# Patient Record
Sex: Male | Born: 1960 | Race: White | Hispanic: No | Marital: Single | Smoking: Current some day smoker
Health system: Southern US, Community
[De-identification: ages and names within clinical notes are randomized; demographics above are authoritative.]

## PROBLEM LIST (undated history)

## (undated) ENCOUNTER — Ambulatory Visit: Admission: EM | Payer: BLUE CROSS/BLUE SHIELD | Source: Home / Self Care

## (undated) DIAGNOSIS — K219 Gastro-esophageal reflux disease without esophagitis: Secondary | ICD-10-CM

## (undated) DIAGNOSIS — J302 Other seasonal allergic rhinitis: Secondary | ICD-10-CM

## (undated) DIAGNOSIS — I10 Essential (primary) hypertension: Secondary | ICD-10-CM

## (undated) HISTORY — PX: BACK SURGERY: SHX140

---

## 2011-09-29 ENCOUNTER — Emergency Department (INDEPENDENT_AMBULATORY_CARE_PROVIDER_SITE_OTHER)
Admission: EM | Admit: 2011-09-29 | Discharge: 2011-09-29 | Disposition: A | Discharge status: Home / Self Care | Payer: BC Managed Care – PPO | Source: Home / Self Care | Attending: Family Medicine | Admitting: Family Medicine

## 2011-09-29 ENCOUNTER — Emergency Department
Admit: 2011-09-29 | Discharge: 2011-09-29 | Disposition: A | Discharge status: Home / Self Care | Payer: BC Managed Care – PPO

## 2011-09-29 ENCOUNTER — Encounter: Payer: Self-pay | Admitting: *Deleted

## 2011-09-29 DIAGNOSIS — M79672 Pain in left foot: Secondary | ICD-10-CM

## 2011-09-29 DIAGNOSIS — M25579 Pain in unspecified ankle and joints of unspecified foot: Secondary | ICD-10-CM

## 2011-09-29 DIAGNOSIS — M79609 Pain in unspecified limb: Secondary | ICD-10-CM

## 2011-09-29 DIAGNOSIS — M109 Gout, unspecified: Secondary | ICD-10-CM

## 2011-09-29 DIAGNOSIS — M25572 Pain in left ankle and joints of left foot: Secondary | ICD-10-CM

## 2011-09-29 HISTORY — DX: Other seasonal allergic rhinitis: J30.2

## 2011-09-29 HISTORY — DX: Gastro-esophageal reflux disease without esophagitis: K21.9

## 2011-09-29 HISTORY — DX: Essential (primary) hypertension: I10

## 2011-09-29 MED ORDER — PREDNISONE 10 MG PO TABS: ORAL_TABLET | ORAL | Status: DC

## 2011-09-29 MED ORDER — HYDROCODONE-ACETAMINOPHEN 5-500 MG PO TABS: 1.0000 | ORAL_TABLET | Freq: Every evening | ORAL | Status: AC | PRN

## 2011-09-29 NOTE — ED Provider Notes (Signed)
History     CSN: [id number]  Arrival date & time 09/29/11  0941   First MD Initiated Contact with Patient 09/29/11 1028      Chief Complaint  Patient presents with  . Foot Pain     HPI Comments: Patient complains of 3 to 4 day history of pain in his left ankle and foot.  The pain is worse with weight bearing and walking.  He recalls no injury.  He has a history of gout, usually causing pain in his right knee.  He has had no fevers, chills, and sweats   Patient is a 51 y.o. male presenting with lower extremity pain. The history is provided by the patient.  Foot Pain This is a new problem. Episode onset: 3 to 4 days ago. The problem occurs constantly. The problem has not changed since onset.Pertinent negatives include no chest pain, no abdominal pain and no shortness of breath. The symptoms are aggravated by walking and standing. The symptoms are relieved by nothing.    Past Medical History  Diagnosis Date  . GERD (gastroesophageal reflux disease)   . Gout   . Hypertension   . Seasonal allergies     Past Surgical History  Procedure Date  . Back surgery     Family History  Problem Relation Age of Onset  . Hypertension Father     History  Substance Use Topics  . Smoking status: Current Some Day Smoker  . Smokeless tobacco: Not on file  . Alcohol Use: Yes      Review of Systems  Respiratory: Negative for shortness of breath.   Cardiovascular: Negative for chest pain.  Gastrointestinal: Negative for abdominal pain.  All other systems reviewed and are negative.    Allergies  Review of patient's allergies indicates no known allergies.  Home Medications   Current Outpatient Rx  Name Route Sig Dispense Refill  . ALLOPURINOL 100 MG PO TABS Oral Take 100 mg by mouth daily.    Marland Kitchen FEXOFENADINE HCL 180 MG PO TABS Oral Take 180 mg by mouth daily.    Marland Kitchen FLUTICASONE PROPIONATE 50 MCG/ACT NA SUSP Nasal Place 2 sprays into the nose daily.    Marland Kitchen HYDROCHLOROTHIAZIDE 25 MG PO  TABS Oral Take 25 mg by mouth daily.    . INDOMETHACIN 50 MG PO CAPS Oral Take 50 mg by mouth 2 (two) times daily with a meal.    . METOPROLOL SUCCINATE ER 25 MG PO TB24 Oral Take 25 mg by mouth daily.    Marland Kitchen OMEPRAZOLE 20 MG PO CPDR Oral Take 20 mg by mouth daily.    Marland Kitchen HYDROCODONE-ACETAMINOPHEN 5-500 MG PO TABS Oral Take 1 tablet by mouth at bedtime as needed for pain. 10 tablet 0  . PREDNISONE 10 MG PO TABS  Take 2 tabs by mouth twice daily for three days, then one tab twice daily for 2 days, then 1 tab daily for two days.  Take PC 18 tablet 0    BP 114/78  Pulse 62  Temp(Src) 97.7 F (36.5 C) (Oral)  Resp 18  Ht 5\' 7"  (1.702 m)  Wt 215 lb 4 oz (97.637 kg)  BMI 33.71 kg/m2  SpO2 96%  Physical Exam  Nursing note and vitals reviewed. Constitutional: He is oriented to person, place, and time. He appears well-developed and well-nourished.  HENT:  Mouth/Throat: Oropharynx is clear and moist.  Eyes: Conjunctivae and EOM are normal. Pupils are equal, round, and reactive to light.  Neck: Neck supple.  Cardiovascular:  Normal rate, regular rhythm and normal heart sounds.   Pulmonary/Chest: Effort normal.  Abdominal: Soft. There is no tenderness.  Musculoskeletal: He exhibits tenderness.       Left ankle: He exhibits normal range of motion, no swelling, no ecchymosis, no deformity, no laceration and normal pulse. tenderness. Lateral malleolus and medial malleolus tenderness found. Achilles tendon normal.       Left foot: He exhibits tenderness and bony tenderness. He exhibits normal range of motion, no swelling, normal capillary refill, no crepitus, no deformity and no laceration.       Feet:       There is tenderness to palpation as noted on diagram over the first MTP joint and midfoot.  Mild ankle tenderness also palpated.  No erythema or warmth.  Distal Neurovascular function is intact.   Lymphadenopathy:    He has no cervical adenopathy.  Neurological: He is alert and oriented to person,  place, and time.  Skin: Skin is warm and dry. No rash noted. No erythema.    ED Course  Procedures none  Labs Reviewed - No data to display Dg Foot 2 Views Left  09/29/2011  *RADIOLOGY REPORT*  Clinical Data: Left foot pain.  History of gout.  LEFT FOOT - 2 VIEW  Comparison: None  Findings: No acute bony abnormality.  Specifically, no fracture, subluxation, or dislocation.  Soft tissues are intact.  Normal joint spaces and bone mineralization.  IMPRESSION: Unremarkable study.  Original Report Authenticated By: Cyndie Chime, M.D.     1. Left foot pain   2. Left ankle pain   3. Gout       MDM  Begin tapering course of prednisone.  Lortab at bedtime for pain. Increase fluid intake. Followup with Family Doctor if not improved in one week.          Lattie Haw, MD 10/03/11 2222

## 2011-09-29 NOTE — Discharge Instructions (Signed)
Increase fluid intake.   Gout Gout is an inflammatory condition (arthritis) caused by a buildup of uric acid crystals in the joints. Uric acid is a chemical that is normally present in the blood. Under some circumstances, uric acid can form into crystals in your joints. This causes joint redness, soreness, and swelling (inflammation). Repeat attacks are common. Over time, uric acid crystals can form into masses (tophi) near a joint, causing disfigurement. Gout is treatable and often preventable. CAUSES  The disease begins with elevated levels of uric acid in the blood. Uric acid is produced by your body when it breaks down a naturally found substance called purines. This also happens when you eat certain foods such as meats and fish. Causes of an elevated uric acid level include:  Being passed down from parent to child (heredity).   Diseases that cause increased uric acid production (obesity, psoriasis, some cancers).   Excessive alcohol use.   Diet, especially diets rich in meat and seafood.   Medicines, including certain cancer-fighting drugs (chemotherapy), diuretics, and aspirin.   Chronic kidney disease. The kidneys are no longer able to remove uric acid well.   Problems with metabolism.  Conditions strongly associated with gout include:  Obesity.   High blood pressure.   High cholesterol.   Diabetes.  Not everyone with elevated uric acid levels gets gout. It is not understood why some people get gout and others do not. Surgery, joint injury, and eating too much of certain foods are some of the factors that can lead to gout. SYMPTOMS   An attack of gout comes on quickly. It causes intense pain with redness, swelling, and warmth in a joint.   Fever can occur.   Often, only one joint is involved. Certain joints are more commonly involved:   Base of the big toe.   Knee.   Ankle.   Wrist.   Finger.  Without treatment, an attack usually goes away in a few days to  weeks. Between attacks, you usually will not have symptoms, which is different from many other forms of arthritis. DIAGNOSIS  Your caregiver will suspect gout based on your symptoms and exam. Removal of fluid from the joint (arthrocentesis) is done to check for uric acid crystals. Your caregiver will give you a medicine that numbs the area (local anesthetic) and use a needle to remove joint fluid for exam. Gout is confirmed when uric acid crystals are seen in joint fluid, using a special microscope. Sometimes, blood, urine, and X-ray tests are also used. TREATMENT  There are 2 phases to gout treatment: treating the sudden onset (acute) attack and preventing attacks (prophylaxis). Treatment of an Acute Attack  Medicines are used. These include anti-inflammatory medicines or steroid medicines.   An injection of steroid medicine into the affected joint is sometimes necessary.   The painful joint is rested. Movement can worsen the arthritis.   You may use warm or cold treatments on painful joints, depending which works best for you.   Discuss the use of coffee, vitamin C, or cherries with your caregiver. These may be helpful treatment options.  Treatment to Prevent Attacks After the acute attack subsides, your caregiver may advise prophylactic medicine. These medicines either help your kidneys eliminate uric acid from your body or decrease your uric acid production. You may need to stay on these medicines for a very long time. The early phase of treatment with prophylactic medicine can be associated with an increase in acute gout attacks. For this reason,  during the first few months of treatment, your caregiver may also advise you to take medicines usually used for acute gout treatment. Be sure you understand your caregiver's directions. You should also discuss dietary treatment with your caregiver. Certain foods such as meats and fish can increase uric acid levels. Other foods such as dairy can  decrease levels. Your caregiver can give you a list of foods to avoid. HOME CARE INSTRUCTIONS   Do not take aspirin to relieve pain. This raises uric acid levels.   Only take over-the-counter or prescription medicines for pain, discomfort, or fever as directed by your caregiver.   Rest the joint as much as possible. When in bed, keep sheets and blankets off painful areas.   Keep the affected joint raised (elevated).   Use crutches if the painful joint is in your leg.   Drink enough water and fluids to keep your urine clear or pale yellow. This helps your body get rid of uric acid. Do not drink alcoholic beverages. They slow the passage of uric acid.   Follow your caregiver's dietary instructions. Pay careful attention to the amount of protein you eat. Your daily diet should emphasize fruits, vegetables, whole grains, and fat-free or low-fat milk products.   Maintain a healthy body weight.  SEEK MEDICAL CARE IF:   You have an oral temperature above 102 F (38.9 C).   You develop diarrhea, vomiting, or any side effects from medicines.   You do not feel better in 24 hours, or you are getting worse.  SEEK IMMEDIATE MEDICAL CARE IF:   Your joint becomes suddenly more tender and you have:   Chills.   An oral temperature above 102 F (38.9 C), not controlled by medicine.  MAKE SURE YOU:   Understand these instructions.   Will watch your condition.   Will get help right away if you are not doing well or get worse.  Document Released: 06/18/2000 Document Revised: 06/10/2011 Document Reviewed: 09/29/2009 [facility] Patient Information 2012 [place], [place].

## 2011-09-29 NOTE — ED Notes (Signed)
Pt c/o LT foot/ankle pain and swelling  x 3 days, no injury. No OTC meds. He has a hx of gout.

## 2011-10-21 ENCOUNTER — Encounter: Payer: Self-pay | Admitting: *Deleted

## 2011-10-21 ENCOUNTER — Emergency Department
Admission: EM | Admit: 2011-10-21 | Discharge: 2011-10-21 | Disposition: A | Discharge status: Home / Self Care | Payer: BC Managed Care – PPO | Source: Home / Self Care | Attending: Emergency Medicine | Admitting: Emergency Medicine

## 2011-10-21 DIAGNOSIS — M109 Gout, unspecified: Secondary | ICD-10-CM

## 2011-10-21 DIAGNOSIS — M25561 Pain in right knee: Secondary | ICD-10-CM

## 2011-10-21 MED ORDER — PREDNISONE (PAK) 10 MG PO TABS: 10.0000 mg | ORAL_TABLET | Freq: Every day | ORAL | Status: AC

## 2011-10-21 MED ORDER — COLCHICINE 0.6 MG PO TABS: 0.6000 mg | ORAL_TABLET | Freq: Two times a day (BID) | ORAL | Status: DC

## 2011-10-21 NOTE — ED Notes (Signed)
Patient c/o gout flare-up in right knee x 2 days.

## 2011-10-21 NOTE — ED Provider Notes (Signed)
History     CSN: [id number]  Arrival date & time 10/21/11  1132   First MD Initiated Contact with Patient 10/21/11 1152      Chief Complaint  Patient presents with  . Gout    right knee    (Consider location/radiation/quality/duration/timing/severity/associated sxs/prior treatment) HPI This patient complains of right knee pain for 2 days.  He states that he has a history of gout and has been flaring up much more in the past year.  He states that he did have a buffet this past weekend including crab legs.  He has been taking allopurinol daily and indomethacin but he reports that these medicines do not help.  He is also on HCTZ for blood pressure.  He states that the pain is very sore located only in his right knee however has been in multiple other joints in the past.  It hurts to bend and he states this is very similar to his previous gouty flares.  No trauma, falls, tripping.  No fever or chills.  Past Medical History  Diagnosis Date  . GERD (gastroesophageal reflux disease)   . Gout   . Hypertension   . Seasonal allergies     Past Surgical History  Procedure Date  . Back surgery     Family History  Problem Relation Age of Onset  . Hypertension Father     History  Substance Use Topics  . Smoking status: Current Some Day Smoker  . Smokeless tobacco: Not on file  . Alcohol Use: Yes      Review of Systems  All other systems reviewed and are negative.    Allergies  Review of patient's allergies indicates no known allergies.  Home Medications   Current Outpatient Rx  Name Route Sig Dispense Refill  . ALLOPURINOL 100 MG PO TABS Oral Take 100 mg by mouth daily.    . COLCHICINE 0.6 MG PO TABS Oral Take 1 tablet (0.6 mg total) by mouth 2 (two) times daily. 6 tablet 0  . FEXOFENADINE HCL 180 MG PO TABS Oral Take 180 mg by mouth daily.    Marland Kitchen FLUTICASONE PROPIONATE 50 MCG/ACT NA SUSP Nasal Place 2 sprays into the nose daily.    Marland Kitchen HYDROCHLOROTHIAZIDE 25 MG PO TABS  Oral Take 25 mg by mouth daily.    . INDOMETHACIN 50 MG PO CAPS Oral Take 50 mg by mouth 2 (two) times daily with a meal.    . METOPROLOL SUCCINATE ER 25 MG PO TB24 Oral Take 25 mg by mouth daily.    Marland Kitchen OMEPRAZOLE 20 MG PO CPDR Oral Take 20 mg by mouth daily.    Marland Kitchen PREDNISONE 10 MG PO TABS  Take 2 tabs by mouth twice daily for three days, then one tab twice daily for 2 days, then 1 tab daily for two days.  Take PC 18 tablet 0  . PREDNISONE (PAK) 10 MG PO TABS Oral Take 1 tablet (10 mg total) by mouth daily. 6 day pack, use as directed, Disp 1 pack 21 tablet 0    BP 126/85  Pulse 80  Temp(Src) 97.6 F (36.4 C) (Oral)  Resp 14  Ht 5\' 7"  (1.702 m)  SpO2 94%  Physical Exam  Nursing note and vitals reviewed. Constitutional: He is oriented to person, place, and time. He appears well-developed and well-nourished.  HENT:  Head: Normocephalic and atraumatic.  Eyes: No scleral icterus.  Neck: Neck supple.  Cardiovascular: Regular rhythm and normal heart sounds.   Pulmonary/Chest: Effort normal  and breath sounds normal. No respiratory distress.  Musculoskeletal:       Right knee examination demonstrates some mild warmth and global tenderness to palpation.  He would prefer to keep it straight because bending is painful.  He is slightly antalgic gait secondary to his pain.  No medial or lateral joint line tenderness.  No bruising and no swelling.  Distal neurovascular status is intact.  Neurological: He is alert and oriented to person, place, and time.  Skin: Skin is warm and dry.  Psychiatric: He has a normal mood and affect. His speech is normal.    ED Course  Procedures (including critical care time)  Labs Reviewed - No data to display No results found.   1. Gout   2. Right knee pain       MDM   This patient appears to have a acute gout flare.  I have listed on prednisone and also gave him a prescription for colchicine for the next few days and have warned him of the side effects  of diarrhea.  In addition I told him to followup with his PCP next week since his flare is becoming much more frequent.  I advised him that perhaps changing the hydrochlorothiazide different blood pressure medicine may help reduce flares.  Also possible rheumatology evaluation or prophylactic medications such as colchicine to be prescribed, however I will leave this up to the PCP to decide if they would like to pursue further evaluation.  I discussed this with the patient and let him know that his PCP will make the final decision on further treatment however I do believe that HCTZ may be a culprit of his flares.  One of the most important thing he can do is lose weight.   Marlaine Hind, MD 10/21/11 1200

## 2011-10-23 ENCOUNTER — Telehealth: Payer: Self-pay | Admitting: Family Medicine

## 2011-10-23 NOTE — ED Notes (Signed)
Called Lortab 5/500 po prn qs #5 no refills to [facility] on [address] in [place].  Per Dr. Cathren Harsh

## 2011-12-04 ENCOUNTER — Encounter: Payer: Self-pay | Admitting: *Deleted

## 2011-12-04 ENCOUNTER — Emergency Department
Admission: EM | Admit: 2011-12-04 | Discharge: 2011-12-04 | Disposition: A | Discharge status: Home / Self Care | Payer: BC Managed Care – PPO | Source: Home / Self Care | Attending: Emergency Medicine | Admitting: Emergency Medicine

## 2011-12-04 DIAGNOSIS — B029 Zoster without complications: Secondary | ICD-10-CM

## 2011-12-04 MED ORDER — VALACYCLOVIR HCL 1 G PO TABS: 1000.0000 mg | ORAL_TABLET | Freq: Three times a day (TID) | ORAL | Status: AC

## 2011-12-04 MED ORDER — PREDNISONE (PAK) 10 MG PO TABS: 10.0000 mg | ORAL_TABLET | Freq: Every day | ORAL | Status: AC

## 2011-12-04 MED ORDER — TRAMADOL HCL 50 MG PO TABS: 50.0000 mg | ORAL_TABLET | Freq: Four times a day (QID) | ORAL | Status: AC | PRN

## 2011-12-04 NOTE — ED Notes (Signed)
Pt developed a red painful, burning rash days ago.  Denies coming in contact with anything new.  Rash started on Rt thigh and spread to groin and rt butt

## 2011-12-04 NOTE — ED Provider Notes (Signed)
History     CSN: [id number]  Arrival date & time 12/04/11  6578   First MD Initiated Contact with Patient 12/04/11 1006      Chief Complaint  Patient presents with  . Rash    rt thigh and groin    (Consider location/radiation/quality/duration/timing/severity/associated sxs/prior treatment) HPI This patient comes in today complaining of a rash on his right leg and groin.  He states that is very sensitive to the touch.  It popped up a few days ago and his wife noticed that he had a spot that came up today on his buttock.  He is also been complaining of some groin discomfort as well on the same side.  He has been under some stress recently but is otherwise fairly healthy.  He has not been using any medications or modalities previously.  He does have a history of chickenpox when he was younger.  No other exposures that he knows of.    Past Medical History  Diagnosis Date  . GERD (gastroesophageal reflux disease)   . Gout   . Hypertension   . Seasonal allergies     Past Surgical History  Procedure Date  . Back surgery     Family History  Problem Relation Age of Onset  . Hypertension Father     History  Substance Use Topics  . Smoking status: Current Some Day Smoker  . Smokeless tobacco: Not on file  . Alcohol Use: Yes      Review of Systems  All other systems reviewed and are negative.    Allergies  Review of patient's allergies indicates no known allergies.  Home Medications   Current Outpatient Rx  Name Route Sig Dispense Refill  . ALLOPURINOL 100 MG PO TABS Oral Take 100 mg by mouth daily.    . COLCHICINE 0.6 MG PO TABS Oral Take 1 tablet (0.6 mg total) by mouth 2 (two) times daily. 6 tablet 0  . FEXOFENADINE HCL 180 MG PO TABS Oral Take 180 mg by mouth daily.    Marland Kitchen FLUTICASONE PROPIONATE 50 MCG/ACT NA SUSP Nasal Place 2 sprays into the nose daily.    Marland Kitchen HYDROCHLOROTHIAZIDE 25 MG PO TABS Oral Take 25 mg by mouth daily.    . INDOMETHACIN 50 MG PO CAPS Oral  Take 50 mg by mouth 2 (two) times daily with a meal.    . METOPROLOL SUCCINATE ER 25 MG PO TB24 Oral Take 25 mg by mouth daily.    Marland Kitchen OMEPRAZOLE 20 MG PO CPDR Oral Take 20 mg by mouth daily.    Marland Kitchen PREDNISONE 10 MG PO TABS  Take 2 tabs by mouth twice daily for three days, then one tab twice daily for 2 days, then 1 tab daily for two days.  Take PC 18 tablet 0  . PREDNISONE (PAK) 10 MG PO TABS Oral Take 1 tablet (10 mg total) by mouth daily. 6 day pack, use as directed, Disp 1 pack 21 tablet 0  . PREDNISONE (PAK) 10 MG PO TABS Oral Take 1 tablet (10 mg total) by mouth daily. 6 day pack, use as directed, Disp 1 pack 21 tablet 0  . TRAMADOL HCL 50 MG PO TABS Oral Take 1 tablet (50 mg total) by mouth every 6 (six) hours as needed for pain. 16 tablet 0  . TRAMADOL HCL 50 MG PO TABS Oral Take 1 tablet (50 mg total) by mouth every 6 (six) hours as needed for pain. 15 tablet 0  . VALACYCLOVIR HCL 1  G PO TABS Oral Take 1 tablet (1,000 mg total) by mouth 3 (three) times daily. 21 tablet 0  . VALACYCLOVIR HCL 1 G PO TABS Oral Take 1 tablet (1,000 mg total) by mouth 3 (three) times daily. 21 tablet 0    BP 120/88  Pulse 74  Temp(Src) 98.3 F (36.8 C) (Oral)  Resp 16  Ht 5\' 7"  (1.702 m)  Wt 206 lb 8 oz (93.668 kg)  BMI 32.34 kg/m2  SpO2 97%  Physical Exam  Nursing note and vitals reviewed. Constitutional: He is oriented to person, place, and time. He appears well-developed and well-nourished.  HENT:  Head: Normocephalic and atraumatic.  Eyes: No scleral icterus.  Neck: Neck supple.  Cardiovascular: Regular rhythm and normal heart sounds.   Pulmonary/Chest: Effort normal and breath sounds normal. No respiratory distress.  Neurological: He is alert and oriented to person, place, and time.  Skin: Skin is warm and dry.       He has a raised vesicular tender rash on the upper anterior thigh and one more mild on the buttock.  This is consistent the L2 dermatome.  He also has some inguinal lymphadenopathy  on the right side as well.  No other rashes or lesions are seen.  Psychiatric: He has a normal mood and affect. His speech is normal.    ED Course  Procedures (including critical care time)  Labs Reviewed - No data to display No results found.   1. Shingles       MDM   This patient has shingles in the right L2 dermatomal distribution.  I gave him a prescription for Valtrex, prednisone, and tramadol.  I advised him that he can use over-the-counter cortisone cream.  I have let them know that he needs to stay away from small children, pregnant women, and elderly or immunocompromised individuals.  It is not improving, followup with his PCP or a dermatologist.     Marlaine Hind, MD 12/04/11 1018

## 2011-12-18 ENCOUNTER — Encounter: Payer: Self-pay | Admitting: *Deleted

## 2011-12-18 ENCOUNTER — Emergency Department
Admission: EM | Admit: 2011-12-18 | Discharge: 2011-12-18 | Disposition: A | Discharge status: Home / Self Care | Payer: BC Managed Care – PPO | Source: Home / Self Care

## 2011-12-18 DIAGNOSIS — H6090 Unspecified otitis externa, unspecified ear: Secondary | ICD-10-CM

## 2011-12-18 DIAGNOSIS — B029 Zoster without complications: Secondary | ICD-10-CM

## 2011-12-18 DIAGNOSIS — H60399 Other infective otitis externa, unspecified ear: Secondary | ICD-10-CM

## 2011-12-18 MED ORDER — NEOMYCIN-POLYMYXIN-HC 3.5-10000-1 OT SUSP: 4.0000 [drp] | Freq: Two times a day (BID) | OTIC | Status: AC

## 2011-12-18 NOTE — ED Provider Notes (Signed)
History     CSN: [id number]  Arrival date & time 12/18/11  1347   First MD Initiated Contact with Patient 12/18/11 1413      Chief Complaint  Patient presents with  . Otalgia    R    (Consider location/radiation/quality/duration/timing/severity/associated sxs/prior treatment) HPI 1) this patient recently had a case of shingles and was treated.  He is feeling much better and is still using some, and lotion which is helping.  However his physical therapist will not seen again until he is a note from the doctor saying he is no longer contagious.  2) patient also presents with right ear discomfort for a few weeks.  No fever, chills, nausea, vomiting, hearing loss, ringing in the ears.  He does wear a blue tooth headset every day but has needed to use on the left ear instead in the last week.  He does claim that he occasionally washes off of alcohol.  He has not been swimming recently he does use Q-tips.  3) he also complains of chronic left shoulder pain.  He is alert he been followed by [employer] for right shoulder pain and is currently doing physical therapy.  He claims that the left shoulder pain we'll schedule a from a work injury but he is discouraged because nobody will take a look at it and it has never been x-rayed.  Past Medical History  Diagnosis Date  . GERD (gastroesophageal reflux disease)   . Gout   . Hypertension   . Seasonal allergies     Past Surgical History  Procedure Date  . Back surgery     Family History  Problem Relation Age of Onset  . Hypertension Father     History  Substance Use Topics  . Smoking status: Current Some Day Smoker  . Smokeless tobacco: Not on file  . Alcohol Use: Yes      Review of Systems  All other systems reviewed and are negative.    Allergies  Review of patient's allergies indicates no known allergies.  Home Medications   Current Outpatient Rx  Name Route Sig Dispense Refill  . ALLOPURINOL 100 MG PO  TABS Oral Take 100 mg by mouth daily.    . COLCHICINE 0.6 MG PO TABS Oral Take 1 tablet (0.6 mg total) by mouth 2 (two) times daily. 6 tablet 0  . FEXOFENADINE HCL 180 MG PO TABS Oral Take 180 mg by mouth daily.    Marland Kitchen FLUTICASONE PROPIONATE 50 MCG/ACT NA SUSP Nasal Place 2 sprays into the nose daily.    Marland Kitchen HYDROCHLOROTHIAZIDE 25 MG PO TABS Oral Take 25 mg by mouth daily.    . INDOMETHACIN 50 MG PO CAPS Oral Take 50 mg by mouth 2 (two) times daily with a meal.    . METOPROLOL SUCCINATE ER 25 MG PO TB24 Oral Take 25 mg by mouth daily.    . NEOMYCIN-POLYMYXIN-HC 3.5-10000-1 OT SUSP Otic Place 4 drops in ear(s) 2 (two) times daily. 19 mL 9  . OMEPRAZOLE 20 MG PO CPDR Oral Take 20 mg by mouth daily.    Marland Kitchen PREDNISONE 10 MG PO TABS  Take 2 tabs by mouth twice daily for three days, then one tab twice daily for 2 days, then 1 tab daily for two days.  Take PC 18 tablet 0  . VALACYCLOVIR HCL 1 G PO TABS Oral Take 1 tablet (1,000 mg total) by mouth 3 (three) times daily. 21 tablet 0  . VALACYCLOVIR HCL 1 G PO  TABS Oral Take 1 tablet (1,000 mg total) by mouth 3 (three) times daily. 21 tablet 0    BP 115/79  Pulse 69  Temp 98.2 F (36.8 C) (Oral)  Resp 16  Ht 5\' 7"  (1.702 m)  Wt 207 lb 8 oz (94.121 kg)  BMI 32.50 kg/m2  SpO2 98%  Physical Exam  Nursing note and vitals reviewed. Constitutional: He is oriented to person, place, and time. He appears well-developed and well-nourished.  HENT:  Head: Normocephalic and atraumatic.  Nose: Nose normal.  Mouth/Throat: Uvula is midline and oropharynx is clear and moist.       Right ear canal demonstrates some mild irritation and swelling.  The left side is normal.  Both tympanic range are also normal.  Eyes: No scleral icterus.  Neck: Neck supple.  Cardiovascular: Regular rhythm and normal heart sounds.   Pulmonary/Chest: Effort normal and breath sounds normal. No respiratory distress.  Musculoskeletal:       Left shoulder examination is deferred    Neurological: He is alert and oriented to person, place, and time.  Skin: Skin is warm and dry.       Previous shingles rash in his groin on the right side is much improved from previous there is very small areas of healing vesicles but no signs of infection and no tenderness to palpation, no fluctuance or induration.  Psychiatric: He has a normal mood and affect. His speech is normal.    ED Course  Procedures (including critical care time)  Labs Reviewed - No data to display No results found.   1. Otitis externa   2. Shingles       MDM   He has a mild otitis externa of the right ear canal.  I gave him prescription for antibiotic eardrops.  I told him that he needs to keep his blue tooth earphones clean and consider alternating ears as to not irritate him further.  In addition the shingles that he had previously is much improved from before.  He needs a note to return to physical therapy.  We wrapped that note because the rash is much improved and I do not believe that he is contagious a longer period  For his chronic left shoulder pain, and he claims that this is definitely work-related.  I gave him information for the occupational health office downstairs she can make an appointment for.  We did not evaluate shoulder pain today and did not treat it.  Marlaine Hind, MD 12/18/11 1421

## 2011-12-18 NOTE — ED Notes (Signed)
Pt has had R ear pain for approx. 2 wks. Pt also wants shingles rechecked.

## 2012-01-02 ENCOUNTER — Telehealth: Payer: Self-pay | Admitting: Emergency Medicine

## 2012-01-03 ENCOUNTER — Telehealth: Payer: Self-pay

## 2012-01-03 NOTE — ED Notes (Signed)
I called Ciprodex to [facility] per Dr. Alvester Morin. Patient is aware.

## 2012-01-03 NOTE — ED Notes (Signed)
Patient complains his ear is still hurting and would like a different medication called in. He spoke to someone over the weekend and they called in a refill of the same medication he was taking. I sent a staff message to Dr Alvester Morin for him to address.

## 2012-03-09 ENCOUNTER — Ambulatory Visit (INDEPENDENT_AMBULATORY_CARE_PROVIDER_SITE_OTHER): Payer: BC Managed Care – PPO | Admitting: Sports Medicine

## 2012-03-09 ENCOUNTER — Encounter: Payer: Self-pay | Admitting: *Deleted

## 2012-03-09 ENCOUNTER — Emergency Department
Admission: EM | Admit: 2012-03-09 | Discharge: 2012-03-09 | Disposition: A | Discharge status: Home / Self Care | Payer: BC Managed Care – PPO | Source: Home / Self Care

## 2012-03-09 DIAGNOSIS — I10 Essential (primary) hypertension: Secondary | ICD-10-CM

## 2012-03-09 DIAGNOSIS — M109 Gout, unspecified: Secondary | ICD-10-CM

## 2012-03-09 DIAGNOSIS — M25579 Pain in unspecified ankle and joints of unspecified foot: Secondary | ICD-10-CM

## 2012-03-09 DIAGNOSIS — M25571 Pain in right ankle and joints of right foot: Secondary | ICD-10-CM

## 2012-03-09 MED ORDER — PREDNISONE 50 MG PO TABS: ORAL_TABLET | ORAL | Status: AC

## 2012-03-09 NOTE — Assessment & Plan Note (Signed)
This pleasant patient would also like to establish care with me. I think this is appropriate, certainly an ACE inhibitor would be a good option for his hypertension rather than a thiazide in the setting of gout.

## 2012-03-09 NOTE — Progress Notes (Signed)
Patient ID: Eduardo Mclean, male   DOB: 01/09/1961, 51 y.o.   MRN: [id number] Subjective:    I'm seeing this patient as a consultation for: Dr. Alvester Morin    CC: Right ankle pain  HPI: Eduardo Mclean is a very pleasant 51 year old male with a history of gout who comes in with several days of increasing pain, and swelling he localizes in his right ankle. Of note, he was started on hydrochlorothiazide, and allopurinol by another provider. The ankle is red, swollen, and exquisitely painful to bear weight. The pain is localized, does not radiate.  Past medical history, Surgical history, Family history, Social history, Allergies, and medications have been entered into the medical record, reviewed, and no changes needed.   Review of Systems: No headache, visual changes, nausea, vomiting, diarrhea, constipation, dizziness, abdominal pain, skin rash, fevers, chills, night sweats, weight loss, body aches, joint swelling, muscle aches, chest pain, or shortness of breath.   Objective:   Vitals:  Afebrile, vital signs stable. General: Well Developed, well nourished, and in no acute distress.  Neuro: Alert and oriented x3, extra-ocular muscles intact.  Skin: Warm and dry, no rashes noted.  Respiratory: Not using accessory muscles, speaking in full sentences.  Cardiovascular: Pulses palpable, no extremity edema. Right Ankle: Moderately swollen, somewhat erythematous. There is a fluid wave present. Range of motion is full in all directions. Strength is 5/5 in all directions. Stable lateral and medial ligaments; squeeze test and kleiger test unremarkable; Talar dome nontender; No pain at base of 5th MT; No tenderness over cuboid; No tenderness over N spot or navicular prominence No tenderness on posterior aspects of lateral and medial malleolus No sign of peroneal tendon subluxations or tenderness to palpation Negative tarsal tunnel tinel's   Impression and Recommendations:

## 2012-03-09 NOTE — Assessment & Plan Note (Signed)
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 His presentation may represent gout versus pseudogout. Dr. Alvester Morin will put him on a burst of prednisone, as well as acetaminophen. I will see him back in the office next week, and if no better we can consider an aspiration for crystal analysis, as well as injection of corticosteroid into the ankle joint.

## 2012-03-09 NOTE — ED Notes (Signed)
Patient c/o right ankle pain x 2 days, without injury. He has a string hx of gout and reports it often flares up in his ankle.

## 2012-03-09 NOTE — ED Provider Notes (Signed)
History     CSN: [id number]  Arrival date & time 03/09/12  1404   First MD Initiated Contact with Patient 03/09/12 1420      Chief Complaint  Patient presents with  . Ankle Pain    right ankle   HPI Pt presents today with ankle pain x 2-3 days. Pt with baseline hx/o gout that normally affects knees and ankle. Pt states that he was recently started on allopurinol for gout 3-6 months ago. Pt states that he has had intermittent gout flares since this point. Has had recent gout flare for last 2-3 days. No fevers or chills. Has not been able to bear weight on R ankle. Mild R ankle swelling. Pt also currently taking HCTZ for HTN.  Past Medical History  Diagnosis Date  . GERD (gastroesophageal reflux disease)   . Gout   . Hypertension   . Seasonal allergies     Past Surgical History  Procedure Date  . Back surgery     Family History  Problem Relation Age of Onset  . Hypertension Father     History  Substance Use Topics  . Smoking status: Current Some Day Smoker  . Smokeless tobacco: Not on file  . Alcohol Use: Yes      Review of Systems  All other systems reviewed and are negative.    Allergies  Review of patient's allergies indicates no known allergies.  Home Medications   Current Outpatient Rx  Name Route Sig Dispense Refill  . ALLOPURINOL 100 MG PO TABS Oral Take 100 mg by mouth daily.    Marland Kitchen CIPROFLOXACIN-DEXAMETHASONE 0.3-0.1 % OT SUSP Both Ears Place 4 drops into both ears 2 (two) times daily.    . COLCHICINE 0.6 MG PO TABS Oral Take 1 tablet (0.6 mg total) by mouth 2 (two) times daily. 6 tablet 0  . FEXOFENADINE HCL 180 MG PO TABS Oral Take 180 mg by mouth daily.    Marland Kitchen FLUTICASONE PROPIONATE 50 MCG/ACT NA SUSP Nasal Place 2 sprays into the nose daily.    Marland Kitchen HYDROCHLOROTHIAZIDE 25 MG PO TABS Oral Take 25 mg by mouth daily.    . INDOMETHACIN 50 MG PO CAPS Oral Take 50 mg by mouth 2 (two) times daily with a meal.    . METOPROLOL SUCCINATE ER 25 MG PO TB24 Oral  Take 25 mg by mouth daily.    Marland Kitchen OMEPRAZOLE 20 MG PO CPDR Oral Take 20 mg by mouth daily.    Marland Kitchen PREDNISONE 10 MG PO TABS  Take 2 tabs by mouth twice daily for three days, then one tab twice daily for 2 days, then 1 tab daily for two days.  Take PC 18 tablet 0  . VALACYCLOVIR HCL 1 G PO TABS Oral Take 1 tablet (1,000 mg total) by mouth 3 (three) times daily. 21 tablet 0  . VALACYCLOVIR HCL 1 G PO TABS Oral Take 1 tablet (1,000 mg total) by mouth 3 (three) times daily. 21 tablet 0    BP 126/90  Pulse 73  Resp 14  Ht 5\' 7"  (1.702 m)  Wt 206 lb (93.441 kg)  BMI 32.26 kg/m2  SpO2 98%  Physical Exam  Constitutional: He appears well-developed and well-nourished.  HENT:  Head: Normocephalic and atraumatic.  Eyes: Conjunctivae are normal. Pupils are equal, round, and reactive to light.  Neck: Normal range of motion. Neck supple.  Cardiovascular: Normal rate and regular rhythm.   Pulmonary/Chest: Effort normal and breath sounds normal.  Abdominal: Soft. Bowel sounds  are normal.  Musculoskeletal: Normal range of motion.       + ttp ankle R ankle mortice with mild swelling and warmth.  Decreased ROM in all place.  Neurovascularly intact distally   Neurological: He is alert.    ED Course  Procedures (including critical care time)  Labs Reviewed - No data to display No results found.   1. Gout flare       MDM  Will place on prednisone x 7 days.  Broached issues of HCTZ discontinuation as this medication can cause increased uric acid levels.  Broached starting losartan as this can lower uric acid levels.  Discussed general care and infectious red flags.  Follow up as needed.      The patient and/or caregiver has been counseled thoroughly with regard to treatment plan and/or medications prescribed including dosage, schedule, interactions, rationale for use, and possible side effects and they verbalize understanding. Diagnoses and expected course of recovery discussed and will  return if not improved as expected or if the condition worsens. Patient and/or caregiver verbalized understanding.             Doree Albee, MD 03/09/12 [phone]

## 2013-03-07 ENCOUNTER — Encounter: Payer: Self-pay | Admitting: *Deleted

## 2013-03-07 ENCOUNTER — Emergency Department
Admission: EM | Admit: 2013-03-07 | Discharge: 2013-03-07 | Disposition: A | Discharge status: Home / Self Care | Payer: BC Managed Care – PPO | Source: Home / Self Care | Attending: Family Medicine | Admitting: Family Medicine

## 2013-03-07 DIAGNOSIS — L309 Dermatitis, unspecified: Secondary | ICD-10-CM

## 2013-03-07 DIAGNOSIS — L859 Epidermal thickening, unspecified: Secondary | ICD-10-CM

## 2013-03-07 DIAGNOSIS — L851 Acquired keratosis [keratoderma] palmaris et plantaris: Secondary | ICD-10-CM

## 2013-03-07 DIAGNOSIS — L259 Unspecified contact dermatitis, unspecified cause: Secondary | ICD-10-CM

## 2013-03-07 MED ORDER — AMMONIUM LACTATE 12 % EX CREA: TOPICAL_CREAM | CUTANEOUS | Status: DC

## 2013-03-07 MED ORDER — MICONAZOLE NITRATE 2 % EX CREA: TOPICAL_CREAM | CUTANEOUS | Status: DC

## 2013-03-07 MED ORDER — CEPHALEXIN 500 MG PO CAPS: 500.0000 mg | ORAL_CAPSULE | Freq: Two times a day (BID) | ORAL | Status: DC

## 2013-03-07 NOTE — ED Notes (Signed)
Madhav c/o rash to upper buttocks x 7 months, itches. Also has "dry cracked skin" on right 3rd finger.

## 2013-03-07 NOTE — ED Provider Notes (Signed)
CSN: [id number]     Arrival date & time 03/07/13  1623 History   First MD Initiated Contact with Patient 03/07/13 1640     Chief Complaint  Patient presents with  . Rash     HPI Comments: Patient has two dermatological complaints: 1)  For about 6 months he has had a small scaly area on his right 3rd finger that occasionally cracks and becomes irritated. 2)  For about 8 months he has had a pruritic persistent rash on his right medial buttock.  Patient is a 52 y.o. male presenting with rash. The history is provided by the patient.  Rash Pain location: right middle finger and right buttock. Pain quality comment:  Itching on buttock Pain severity:  Mild Onset quality:  Gradual Timing:  Constant Progression:  Unchanged Chronicity:  Chronic Relieved by:  Nothing Worsened by:  Nothing tried Ineffective treatments: 1% hydrocortisone cream. Associated symptoms: no fever     Past Medical History  Diagnosis Date  . GERD (gastroesophageal reflux disease)   . Gout   . Hypertension   . Seasonal allergies    Past Surgical History  Procedure Laterality Date  . Back surgery     Family History  Problem Relation Age of Onset  . Hypertension Father    History  Substance Use Topics  . Smoking status: Current Some Day Smoker  . Smokeless tobacco: Never Used  . Alcohol Use: Yes    Review of Systems  Constitutional: Negative for fever.  Skin: Positive for rash.  All other systems reviewed and are negative.    Allergies  Review of patient's allergies indicates no known allergies.  Home Medications   Current Outpatient Rx  Name  Route  Sig  Dispense  Refill  . allopurinol (ZYLOPRIM) 100 MG tablet   Oral   Take 100 mg by mouth daily.         Marland Kitchen ammonium lactate (LAC-HYDRIN) 12 % cream      Apply small amount to finger BID   140 g   0   . cephALEXin (KEFLEX) 500 MG capsule   Oral   Take 1 capsule (500 mg total) by mouth 2 (two) times daily.   14 capsule   0   .  fexofenadine (ALLEGRA) 180 MG tablet   Oral   Take 180 mg by mouth daily.         . fluticasone (FLONASE) 50 MCG/ACT nasal spray   Nasal   Place 2 sprays into the nose daily.         . hydrochlorothiazide (HYDRODIURIL) 25 MG tablet   Oral   Take 25 mg by mouth daily.         . indomethacin (INDOCIN) 50 MG capsule   Oral   Take 50 mg by mouth 2 (two) times daily with a meal.         . metoprolol succinate (TOPROL-XL) 25 MG 24 hr tablet   Oral   Take 25 mg by mouth daily.         . miconazole (MICATIN) 2 % cream      Apply to buttock lesion two times daily   30 g   0   . omeprazole (PRILOSEC) 20 MG capsule   Oral   Take 20 mg by mouth daily.          BP 172/106  Pulse 66  Temp(Src) 98.1 F (36.7 C) (Oral)  Resp 14  Ht 5\' 7"  (1.702 m)  Wt 213 lb (  96.616 kg)  BMI 33.35 kg/m2  SpO2 95% Physical Exam  Nursing note and vitals reviewed. Constitutional: He is oriented to person, place, and time. He appears well-developed and well-nourished. No distress.  HENT:  Head: Normocephalic.  Eyes: Conjunctivae are normal. Pupils are equal, round, and reactive to light.  Musculoskeletal:       Hands: On the radial aspect of patient's right third finger, as noted on diagram, is an 6mm by 3mm area of non-specific hyperkeratosis.  Does not appear to be an actual lesion.  No tenderness or erythema.  Neurological: He is alert and oriented to person, place, and time.  Skin: Skin is warm and dry.     On patient's right medial buttock, in intertriginous area as noted on diagram, is a 1cm by 4cm moist macular slightly erythematous and hypopigmented area.  No tenderness to palpation.  No induration or fluctuance    ED Course  Procedures  none    Labs Reviewed  WOUND CULTURE pending       MDM   1. Hyperkeratosis of skin (finger)  2. Dermatitis of perianal region, ?fungal etiology with secondary bacterial infection  Note hypertension on exam   Wound culture lesion  buttock pending. Begin Lac Hydrin cream to hyperkeratosis on finger. Begin Keflex 500mg  BID.  Apply miconazole cream to lesion right buttock May continue 1% hydrocortisone ointment on buttock lesion. Followup with dermatologist if not improving Followup with PCP for hypertension.    Lattie Haw, MD 03/12/13 1036

## 2013-03-13 ENCOUNTER — Telehealth: Payer: Self-pay | Admitting: *Deleted

## 2013-06-11 ENCOUNTER — Encounter: Payer: Self-pay | Admitting: Emergency Medicine

## 2013-06-11 ENCOUNTER — Emergency Department
Admission: EM | Admit: 2013-06-11 | Discharge: 2013-06-11 | Disposition: A | Discharge status: Home / Self Care | Payer: BC Managed Care – PPO | Source: Home / Self Care | Attending: Family Medicine | Admitting: Family Medicine

## 2013-06-11 ENCOUNTER — Emergency Department (INDEPENDENT_AMBULATORY_CARE_PROVIDER_SITE_OTHER): Payer: BC Managed Care – PPO

## 2013-06-11 DIAGNOSIS — R1033 Periumbilical pain: Secondary | ICD-10-CM

## 2013-06-11 DIAGNOSIS — M545 Low back pain: Secondary | ICD-10-CM

## 2013-06-11 DIAGNOSIS — M5410 Radiculopathy, site unspecified: Secondary | ICD-10-CM

## 2013-06-11 DIAGNOSIS — IMO0002 Reserved for concepts with insufficient information to code with codable children: Secondary | ICD-10-CM

## 2013-06-11 MED ORDER — HYDROCODONE-ACETAMINOPHEN 5-325 MG PO TABS: ORAL_TABLET | ORAL | Status: DC

## 2013-06-11 MED ORDER — CYCLOBENZAPRINE HCL 10 MG PO TABS: 10.0000 mg | ORAL_TABLET | Freq: Three times a day (TID) | ORAL | Status: DC

## 2013-06-11 MED ORDER — PREDNISONE 20 MG PO TABS: 20.0000 mg | ORAL_TABLET | Freq: Two times a day (BID) | ORAL | Status: DC

## 2013-06-11 NOTE — ED Notes (Signed)
Eduardo Mclean c/o right lower back pain that radiates to buttocks and right thigh without injury x 2 weeks. He also c/o naval soreness without bulging x 2 weeks.
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
## 2013-06-18 ENCOUNTER — Encounter: Payer: Self-pay | Admitting: Sports Medicine

## 2013-06-18 ENCOUNTER — Ambulatory Visit (INDEPENDENT_AMBULATORY_CARE_PROVIDER_SITE_OTHER): Payer: BC Managed Care – PPO | Admitting: Sports Medicine

## 2013-06-18 VITALS — BP 153/93 | HR 75 | Wt 211.0 lb

## 2013-06-18 DIAGNOSIS — M109 Gout, unspecified: Secondary | ICD-10-CM

## 2013-06-18 DIAGNOSIS — Z0000 Encounter for general adult medical examination without abnormal findings: Secondary | ICD-10-CM

## 2013-06-18 DIAGNOSIS — I10 Essential (primary) hypertension: Secondary | ICD-10-CM

## 2013-06-18 DIAGNOSIS — R1033 Periumbilical pain: Secondary | ICD-10-CM | POA: Insufficient documentation

## 2013-06-18 DIAGNOSIS — Z299 Encounter for prophylactic measures, unspecified: Secondary | ICD-10-CM | POA: Insufficient documentation

## 2013-06-18 MED ORDER — LOSARTAN POTASSIUM-HCTZ 100-25 MG PO TABS: 1.0000 | ORAL_TABLET | Freq: Every day | ORAL | Status: DC

## 2013-06-18 NOTE — Progress Notes (Signed)
  Subjective:    CC: Establish care.   HPI:  Gout: Well controlled.  Hypertension: Still elevated, he did not have any worsening gout since doing hydrochlorothiazide. No headaches, shortness of breath, chest pain.  Umbilical pain: Noted earlier, mild and worse with palpation and Valsalva, present for months, was told it might be a hernia by the urgent care physician.  Preventive measure: Desires complete physical today. Has not yet had a colonoscopy.  Past medical history, Surgical history, Family history not pertinant except as noted below, Social history, Allergies, and medications have been entered into the medical record, reviewed, and no changes needed.   Review of Systems: No headache, visual changes, nausea, vomiting, diarrhea, constipation, dizziness, abdominal pain, skin rash, fevers, chills, night sweats, swollen lymph nodes, weight loss, chest pain, body aches, joint swelling, muscle aches, shortness of breath, mood changes, visual or auditory hallucinations.  Objective:    General: Well Developed, well nourished, and in no acute distress.  Neuro: Alert and oriented x3, extra-ocular muscles intact, sensation grossly intact.  HEENT: Normocephalic, atraumatic, pupils equal round reactive to light, neck supple, no masses, no lymphadenopathy, thyroid nonpalpable.  Skin: Warm and dry, no rashes noted.  Cardiac: Regular rate and rhythm, no murmurs rubs or gallops.  Respiratory: Clear to auscultation bilaterally. Not using accessory muscles, speaking in full sentences.  Abdominal: Soft, nontender, nondistended, positive bowel sounds, no masses, no organomegaly.  Musculoskeletal: Shoulder, elbow, wrist, hip, knee, ankle stable, and with full range of motion.  Impression and Recommendations:    The patient was counselled, risk factors were discussed, anticipatory guidance given.

## 2013-06-18 NOTE — Assessment & Plan Note (Signed)
Switching to losartan/HCTZ. Return in 2 weeks to recheck.

## 2013-06-18 NOTE — Assessment & Plan Note (Signed)
Continue allopurinol 

## 2013-06-18 NOTE — Assessment & Plan Note (Signed)
It is possible this represents an umbilical hernia. Referral to Gen. surgery.

## 2013-06-18 NOTE — Assessment & Plan Note (Addendum)
Complete physical. Checking routine blood work. Referral to gastroenterology for colon cancer screening.

## 2013-06-19 ENCOUNTER — Telehealth: Payer: Self-pay

## 2013-06-19 LAB — LIPID PANEL
Cholesterol: 124 mg/dL (ref 0–200)
HDL: 60 mg/dL (ref >39)
LDL Cholesterol: 26 mg/dL (ref 0–99)
Total CHOL/HDL Ratio: 2.1 ratio
Triglycerides: 190 mg/dL — ABNORMAL HIGH (ref <150)
VLDL: 38 mg/dL (ref 0–40)

## 2013-06-19 LAB — COMPREHENSIVE METABOLIC PANEL
ALT: 32 U/L (ref 0–53)
CO2: 22 mEq/L (ref 19–32)
Calcium: 9.2 mg/dL (ref 8.4–10.5)
Chloride: 107 mEq/L (ref 96–112)
Creat: 1.06 mg/dL (ref 0.50–1.35)
Glucose, Bld: 86 mg/dL (ref 70–99)
Sodium: 143 mEq/L (ref 135–145)
Total Bilirubin: 0.3 mg/dL (ref 0.3–1.2)
Total Protein: 7.3 g/dL (ref 6.0–8.3)

## 2013-06-19 LAB — CBC
HCT: 43.7 % (ref 39.0–52.0)
Hemoglobin: 14.8 g/dL (ref 13.0–17.0)
MCH: 29 pg (ref 26.0–34.0)
MCHC: 33.9 g/dL (ref 30.0–36.0)
MCV: 85.5 fL (ref 78.0–100.0)
Platelets: 194 10*3/uL (ref 150–400)
RBC: 5.11 MIL/uL (ref 4.22–5.81)
RDW: 15.1 % (ref 11.5–15.5)
WBC: 6.8 10*3/uL (ref 4.0–10.5)

## 2013-06-19 LAB — TESTOSTERONE, FREE, TOTAL, SHBG
Sex Hormone Binding: 24 nmol/L (ref 13–71)
Testosterone, Free: 63.5 pg/mL (ref 47.0–244.0)
Testosterone-% Free: 2.3 % (ref 1.6–2.9)
Testosterone: 274 ng/dL — ABNORMAL LOW (ref 300–890)

## 2013-06-19 LAB — TSH: TSH: 0.461 u[IU]/mL (ref 0.350–4.500)

## 2013-06-19 LAB — HEMOGLOBIN A1C
Hgb A1c MFr Bld: 6.2 % — ABNORMAL HIGH (ref <5.7)
Mean Plasma Glucose: 131 mg/dL — ABNORMAL HIGH (ref <117)

## 2013-06-19 LAB — COMPREHENSIVE METABOLIC PANEL WITH GFR
AST: 25 U/L (ref 0–37)
Albumin: 4.5 g/dL (ref 3.5–5.2)
Alkaline Phosphatase: 67 U/L (ref 39–117)
BUN: 14 mg/dL (ref 6–23)
Potassium: 4.2 meq/L (ref 3.5–5.3)

## 2013-06-19 LAB — URIC ACID: Uric Acid, Serum: 3.3 mg/dL — ABNORMAL LOW (ref 4.0–7.8)

## 2013-06-19 MED ORDER — AMLODIPINE BESYLATE 10 MG PO TABS: 5.0000 mg | ORAL_TABLET | Freq: Every day | ORAL | Status: DC

## 2013-06-19 MED ORDER — LOSARTAN POTASSIUM 100 MG PO TABS: 100.0000 mg | ORAL_TABLET | Freq: Every day | ORAL | Status: DC

## 2013-06-19 NOTE — Telephone Encounter (Signed)
Spoke to patient advised him that Amlodpine was also added. Mackie Holness,CMA

## 2013-06-19 NOTE — Telephone Encounter (Signed)
Patient called stated that he is only taking the Losartan and not the HTCZ he is requesting a refill of that medication and the metoprolol  Sent to his pharmacy. Rhonda Cunningham,CMA

## 2013-06-19 NOTE — Telephone Encounter (Signed)
Losartan alone may not be enough, I am going to add amlodipine 10 mg, he will use one half tablet until he sees me again.

## 2013-06-23 ENCOUNTER — Emergency Department
Admission: EM | Admit: 2013-06-23 | Discharge: 2013-06-23 | Disposition: A | Discharge status: Home / Self Care | Payer: BC Managed Care – PPO | Source: Home / Self Care | Attending: Family Medicine | Admitting: Family Medicine

## 2013-06-23 ENCOUNTER — Encounter: Payer: Self-pay | Admitting: Emergency Medicine

## 2013-06-23 DIAGNOSIS — R110 Nausea: Secondary | ICD-10-CM

## 2013-06-23 DIAGNOSIS — R197 Diarrhea, unspecified: Secondary | ICD-10-CM

## 2013-06-23 MED ORDER — ONDANSETRON 4 MG PO TBDP: 4.0000 mg | ORAL_TABLET | Freq: Three times a day (TID) | ORAL | Status: DC | PRN

## 2013-06-23 MED ORDER — HYDROCODONE-ACETAMINOPHEN 5-325 MG PO TABS: ORAL_TABLET | ORAL | Status: DC

## 2013-06-23 MED ORDER — ONDANSETRON 4 MG PO TBDP: 4.0000 mg | ORAL_TABLET | Freq: Once | ORAL | Status: DC

## 2013-06-23 NOTE — ED Provider Notes (Signed)
CSN: [id number]     Arrival date & time 06/23/13  1436 History   First MD Initiated Contact with Patient 06/23/13 1630     Chief Complaint  Patient presents with  . Fever    x 1 day  . Emesis    x 1 day  . Diarrhea    x 1 day      HPI Comments: Patient complains of two day history of myalgias, chills/sweats, abdominal cramps, nausea/vomiting, headache, and watery diarrhea.  Denies recent foreign travel, or drinking untreated water in a wilderness environment.   He is able to drink fluids  The history is provided by the patient.    Past Medical History  Diagnosis Date  . GERD (gastroesophageal reflux disease)   . Gout   . Hypertension   . Seasonal allergies    Past Surgical History  Procedure Laterality Date  . Back surgery     Family History  Problem Relation Age of Onset  . Hypertension Father    History  Substance Use Topics  . Smoking status: Current Some Day Smoker  . Smokeless tobacco: Never Used  . Alcohol Use: Yes    Review of Systems No sore throat Np cough No pleuritic pain No wheezing No nasal congestion No post-nasal drainage No sinus pain/pressure No itchy/red eyes No earache No hemoptysis No SOB + fever, + chills + nausea + vomiting + abdominal pain + diarrhea No urinary symptoms No skin rash + fatigue + myalgias + headache Used OTC meds without relief  Allergies  Review of patient's allergies indicates no known allergies.  Home Medications   Current Outpatient Rx  Name  Route  Sig  Dispense  Refill  . allopurinol (ZYLOPRIM) 100 MG tablet   Oral   Take 100 mg by mouth daily.         Marland Kitchen amLODipine (NORVASC) 10 MG tablet   Oral   Take 0.5 tablets (5 mg total) by mouth daily.   30 tablet   11   . cyclobenzaprine (FLEXERIL) 10 MG tablet   Oral   Take 1 tablet (10 mg total) by mouth 3 (three) times daily.   30 tablet   0   . fexofenadine (ALLEGRA) 180 MG tablet   Oral   Take 180 mg by mouth daily.         .  fluticasone (FLONASE) 50 MCG/ACT nasal spray   Nasal   Place 2 sprays into the nose daily.         Marland Kitchen HYDROcodone-acetaminophen (NORCO/VICODIN) 5-325 MG per tablet      Take one by mouth at bedtime as needed for pain   10 tablet   0   . indomethacin (INDOCIN) 50 MG capsule   Oral   Take 50 mg by mouth 2 (two) times daily with a meal.         . losartan (COZAAR) 100 MG tablet   Oral   Take 1 tablet (100 mg total) by mouth daily.   90 tablet   0   . metoprolol succinate (TOPROL-XL) 25 MG 24 hr tablet   Oral   Take 25 mg by mouth daily.         Marland Kitchen omeprazole (PRILOSEC) 20 MG capsule   Oral   Take 20 mg by mouth daily.         . ondansetron (ZOFRAN ODT) 4 MG disintegrating tablet   Oral   Take 1 tablet (4 mg total) by mouth every 8 (eight) hours  as needed for nausea or vomiting.   15 tablet   0   . predniSONE (DELTASONE) 20 MG tablet   Oral   Take 1 tablet (20 mg total) by mouth 2 (two) times daily.   10 tablet   0    BP 161/113  Pulse 65  Temp(Src) 99.4 F (37.4 C)  Ht 5\' 7"  (1.702 m)  Wt 209 lb (94.802 kg)  BMI 32.73 kg/m2  SpO2 96% Physical Exam Nursing notes and Vital Signs reviewed. Appearance:  Patient appears stated age, and in no acute distress.  Patient is obese. Eyes:  Pupils are equal, round, and reactive to light and accomodation.  Extraocular movement is intact.  Conjunctivae are not inflamed  Ears:  Canals normal.  Tympanic membranes normal.  Nose:  Normal turbinates.  No sinus tenderness.   Pharynx:  Normal; moist mucous membranes  Neck:  Supple.  No adenopathy Lungs:  Clear to auscultation.  Breath sounds are equal.  Heart:  Regular rate and rhythm without murmurs, rubs, or gallops.  Abdomen:  Nontender without masses or hepatosplenomegaly.  Bowel sounds are increased.  No CVA or flank tenderness.  Extremities:  No edema.  No calf tenderness Skin:  No rash present.   ED Course  Procedures  none      MDM   1. Nausea alone; suspect  viral gastroenteritis   2. Diarrhea    Zofran 4mg  PO Rx for zofran Begin clear liquids (adult rehydration solution while having diarrhea) until improved, then advance to a bland diet.  Then gradually resume a regular diet when tolerated.  Avoid milk products until well.  To decrease diarrhea, mix one heaping tablespoon Citrucel (methylcellulose) in 8 oz water and drink one to three times daily.  When stools become more formed, may take Imodium (loperamide) once or twice daily to decrease stool frequency.  Followup with PCP for decreased control BP If symptoms become significantly worse during the night or over the weekend, proceed to the local emergency room.    Lattie Haw, MD 06/25/13 [phone]

## 2013-06-23 NOTE — ED Notes (Signed)
Linsey complains of fever, chills, sweats, nausea, vomiting, diarrhea and body aches for 1 day.

## 2013-07-16 ENCOUNTER — Ambulatory Visit (INDEPENDENT_AMBULATORY_CARE_PROVIDER_SITE_OTHER): Payer: BC Managed Care – PPO | Admitting: Sports Medicine

## 2013-07-16 ENCOUNTER — Encounter: Payer: Self-pay | Admitting: Sports Medicine

## 2013-07-16 VITALS — BP 130/85 | HR 69 | Wt 221.0 lb

## 2013-07-16 DIAGNOSIS — M545 Low back pain, unspecified: Secondary | ICD-10-CM | POA: Insufficient documentation

## 2013-07-16 DIAGNOSIS — N529 Male erectile dysfunction, unspecified: Secondary | ICD-10-CM

## 2013-07-16 DIAGNOSIS — M109 Gout, unspecified: Secondary | ICD-10-CM

## 2013-07-16 DIAGNOSIS — Z299 Encounter for prophylactic measures, unspecified: Secondary | ICD-10-CM

## 2013-07-16 DIAGNOSIS — I10 Essential (primary) hypertension: Secondary | ICD-10-CM

## 2013-07-16 DIAGNOSIS — E291 Testicular hypofunction: Secondary | ICD-10-CM

## 2013-07-16 MED ORDER — MELOXICAM 15 MG PO TABS: ORAL_TABLET | ORAL | Status: DC

## 2013-07-16 MED ORDER — VARDENAFIL HCL 20 MG PO TABS: 20.0000 mg | ORAL_TABLET | ORAL | Status: DC | PRN

## 2013-07-16 NOTE — Progress Notes (Signed)
  Subjective:    CC: Follow up  HPI: Hypertension: Well controlled.  Erectile dysfunction: Needs a refill on Levitra.  Low back pain: Moderate, persistent, no injuries, localized in the midline of the low back radiating down both legs, worse when going from a sitting to a standing position, no bowel or bladder dysfunction, no constitutional symptoms.  Male hypogonadism: would like to think about ways to supplement this.  Past medical history, Surgical history, Family history not pertinant except as noted below, Social history, Allergies, and medications have been entered into the medical record, reviewed, and no changes needed.   Review of Systems: No fevers, chills, night sweats, weight loss, chest pain, or shortness of breath.   Objective:    General: Well Developed, well nourished, and in no acute distress.  Neuro: Alert and oriented x3, extra-ocular muscles intact, sensation grossly intact.  HEENT: Normocephalic, atraumatic, pupils equal round reactive to light, neck supple, no masses, no lymphadenopathy, thyroid nonpalpable.  Skin: Warm and dry, no rashes. Cardiac: Regular rate and rhythm, no murmurs rubs or gallops, no lower extremity edema.  Respiratory: Clear to auscultation bilaterally. Not using accessory muscles, speaking in full sentences. Back Exam:  Inspection: Unremarkable  Motion: Flexion 45 deg, Extension 45 deg, Side Bending to 45 deg bilaterally,  Rotation to 45 deg bilaterally  SLR laying: Negative  XSLR laying: Negative  Palpable tenderness: None. FABER: negative. Sensory change: Gross sensation intact to all lumbar and sacral dermatomes.  Reflexes: 2+ at both patellar tendons, 2+ at achilles tendons, Babinski's downgoing.  Strength at foot  Plantar-flexion: 5/5 Dorsi-flexion: 5/5 Eversion: 5/5 Inversion: 5/5  Leg strength  Quad: 5/5 Hamstring: 5/5 Hip flexor: 5/5 Hip abductors: 5/5  Gait unremarkable.  Impression and Recommendations:

## 2013-07-16 NOTE — Assessment & Plan Note (Signed)
We discussed the various forms of testosterone supplementation as well as the risks. He is going to do some research and will let me know whether he desires to do gel, patch, or injections.

## 2013-07-16 NOTE — Assessment & Plan Note (Signed)
Currently well-controlled on amlodipine, losartan, metoprolol. No changes.

## 2013-07-16 NOTE — Assessment & Plan Note (Signed)
Refilling Levitra 20mg .

## 2013-07-16 NOTE — Assessment & Plan Note (Signed)
Patient is currently scheduled for colonoscopy.

## 2013-07-16 NOTE — Assessment & Plan Note (Signed)
Mobic, home exercises. X-rays do show facet spondylosis. Return in one month, MRI if no better.

## 2013-07-16 NOTE — Assessment & Plan Note (Signed)
No acute flares, uric acid levels of 3.3. No changes.

## 2013-08-13 ENCOUNTER — Ambulatory Visit: Payer: BC Managed Care – PPO | Admitting: Sports Medicine

## 2013-08-14 ENCOUNTER — Ambulatory Visit (INDEPENDENT_AMBULATORY_CARE_PROVIDER_SITE_OTHER): Payer: BC Managed Care – PPO | Admitting: Sports Medicine

## 2013-08-14 ENCOUNTER — Encounter: Payer: Self-pay | Admitting: Sports Medicine

## 2013-08-14 VITALS — BP 145/88 | HR 72 | Ht 67.0 in | Wt 220.0 lb

## 2013-08-14 DIAGNOSIS — M545 Low back pain, unspecified: Secondary | ICD-10-CM

## 2013-08-14 DIAGNOSIS — E291 Testicular hypofunction: Secondary | ICD-10-CM

## 2013-08-14 MED ORDER — TESTOSTERONE CYPIONATE 200 MG/ML IM SOLN
200.0000 mg | INTRAMUSCULAR | Status: DC
  Administered 2013-08-14: 200 mg via INTRAMUSCULAR

## 2013-08-14 MED ORDER — PREDNISONE (PAK) 10 MG PO TABS: ORAL_TABLET | ORAL | Status: DC

## 2013-08-14 NOTE — Assessment & Plan Note (Signed)
Testosterone 200 mg intramuscular every 2 weeks. In a month and a half we will check his testosterone levels.

## 2013-08-14 NOTE — Progress Notes (Signed)
  Subjective:    CC: Followup  HPI: Lumbar spondylosis: With facet degenerative changes on x-ray, has been doing home exercises and taking Mobic, overall he is better but would like to try a course of prednisone. He is not yet ready to consider interventional treatment.  Male hypogonadism: Desires to start testosterone injections today. Risks and benefits explained.  Hypertension: Typically well controlled, in pain and somewhat elevated today.  Past medical history, Surgical history, Family history not pertinant except as noted below, Social history, Allergies, and medications have been entered into the medical record, reviewed, and no changes needed.   Review of Systems: No fevers, chills, night sweats, weight loss, chest pain, or shortness of breath.   Objective:    General: Well Developed, well nourished, and in no acute distress.  Neuro: Alert and oriented x3, extra-ocular muscles intact, sensation grossly intact.  HEENT: Normocephalic, atraumatic, pupils equal round reactive to light, neck supple, no masses, no lymphadenopathy, thyroid nonpalpable.  Skin: Warm and dry, no rashes. Cardiac: Regular rate and rhythm, no murmurs rubs or gallops, no lower extremity edema.  Respiratory: Clear to auscultation bilaterally. Not using accessory muscles, speaking in full sentences.  Impression and Recommendations:

## 2013-08-14 NOTE — Assessment & Plan Note (Signed)
Mild facet arthrosis on x-ray. Insufficient improvement with Mobic. Prednisone per patient request, he will return in 2 weeks and we can consider MRI if no better.

## 2013-08-27 ENCOUNTER — Ambulatory Visit: Payer: BC Managed Care – PPO | Admitting: Sports Medicine

## 2013-08-28 ENCOUNTER — Encounter: Payer: Self-pay | Admitting: Sports Medicine

## 2013-08-28 ENCOUNTER — Ambulatory Visit (INDEPENDENT_AMBULATORY_CARE_PROVIDER_SITE_OTHER): Payer: BC Managed Care – PPO | Admitting: Sports Medicine

## 2013-08-28 VITALS — BP 127/79 | HR 75 | Temp 97.6°F | Ht 67.0 in | Wt 227.0 lb

## 2013-08-28 DIAGNOSIS — E291 Testicular hypofunction: Secondary | ICD-10-CM

## 2013-08-28 DIAGNOSIS — M545 Low back pain, unspecified: Secondary | ICD-10-CM

## 2013-08-28 MED ORDER — TESTOSTERONE CYPIONATE 200 MG/ML IM SOLN
200.0000 mg | Freq: Once | INTRAMUSCULAR | Status: AC
  Administered 2013-08-28: 200 mg via INTRAMUSCULAR

## 2013-08-28 NOTE — Progress Notes (Signed)
  Subjective:    CC: Followup  HPI: Low back pain: Localized midline radiating down both legs, worse when going from sitting to standing, moderate, persistent however has now resolved since doing prednisone. Patient is happy with the results so far, he does desire to continue his home exercises, he's going to come back to see me on an as-needed basis of the back pain recurs.  Male hypogonadism: Testosterone injection given.  Past medical history, Surgical history, Family history not pertinant except as noted below, Social history, Allergies, and medications have been entered into the medical record, reviewed, and no changes needed.   Review of Systems: No fevers, chills, night sweats, weight loss, chest pain, or shortness of breath.   Objective:    General: Well Developed, well nourished, and in no acute distress.  Neuro: Alert and oriented x3, extra-ocular muscles intact, sensation grossly intact.  HEENT: Normocephalic, atraumatic, pupils equal round reactive to light, neck supple, no masses, no lymphadenopathy, thyroid nonpalpable.  Skin: Warm and dry, no rashes. Cardiac: Regular rate and rhythm, no murmurs rubs or gallops, no lower extremity edema.  Respiratory: Clear to auscultation bilaterally. Not using accessory muscles, speaking in full sentences.   Impression and Recommendations:

## 2013-08-28 NOTE — Assessment & Plan Note (Signed)
Continues to do well, symptoms do sound discogenic however he had widespread facet arthritis as well as multilevel degenerative changes in his lumbar discs. At this point we will just watch this for now, and proceed with MRI if he calls back with worsening pain.

## 2013-08-28 NOTE — Assessment & Plan Note (Signed)
Testosterone injection as above. 

## 2013-08-29 ENCOUNTER — Ambulatory Visit: Payer: BC Managed Care – PPO | Admitting: Sports Medicine

## 2013-09-03 ENCOUNTER — Other Ambulatory Visit: Payer: Self-pay | Admitting: *Deleted

## 2013-09-03 MED ORDER — OMEPRAZOLE 20 MG PO CPDR: 20.0000 mg | DELAYED_RELEASE_CAPSULE | Freq: Every day | ORAL | Status: DC

## 2013-09-07 ENCOUNTER — Encounter: Payer: Self-pay | Admitting: Emergency Medicine

## 2013-09-07 ENCOUNTER — Emergency Department
Admission: EM | Admit: 2013-09-07 | Discharge: 2013-09-07 | Disposition: A | Discharge status: Home / Self Care | Payer: BC Managed Care – PPO | Source: Home / Self Care | Attending: Family Medicine | Admitting: Family Medicine

## 2013-09-07 DIAGNOSIS — K644 Residual hemorrhoidal skin tags: Secondary | ICD-10-CM

## 2013-09-07 MED ORDER — HYDROCORTISONE ACETATE 25 MG RE SUPP: 25.0000 mg | Freq: Two times a day (BID) | RECTAL | Status: DC

## 2013-09-07 NOTE — ED Provider Notes (Signed)
CSN: [id number]     Arrival date & time 09/07/13  1110 History   First MD Initiated Contact with Patient 09/07/13 1201     Chief Complaint  Patient presents with  . Hemorrhoids      HPI Comments: Patient states that he recently underwent colonoscopy.   Over the past four days he has had rectal burning and pain with defecation, and 3 days ago he noted a small amount of blood on toilet tissue.  He complains of pain when sitting.  He reports that his screening colonoscopy was negative except for a polyp, and pathology was negative.  Patient is a 53 y.o. male presenting with hematochezia. The history is provided by the patient.  Rectal Bleeding Quality:  Bright red Amount:  Scant Timing:  Rare Progression:  Resolved Chronicity:  New Context: defecation and rectal pain   Context: not constipation   Pain details:    Quality:  Burning   Severity:  Mild   Duration:  4 days   Timing:  Worse with defecation   Progression:  Unchanged Similar prior episodes: no   Relieved by:  Nothing Worsened by:  Defecation Ineffective treatments:  None tried Associated symptoms: no abdominal pain, no fever, no hematemesis and no vomiting     Past Medical History  Diagnosis Date  . GERD (gastroesophageal reflux disease)   . Gout   . Hypertension   . Seasonal allergies    Past Surgical History  Procedure Laterality Date  . Back surgery     Family History  Problem Relation Age of Onset  . Hypertension Father    History  Substance Use Topics  . Smoking status: Current Some Day Smoker  . Smokeless tobacco: Never Used  . Alcohol Use: Yes    Review of Systems  Constitutional: Negative for fever.  Gastrointestinal: Positive for hematochezia. Negative for vomiting, abdominal pain and hematemesis.  All other systems reviewed and are negative.    Allergies  Review of patient's allergies indicates no known allergies.  Home Medications   Current Outpatient Rx  Name  Route  Sig  Dispense   Refill  . allopurinol (ZYLOPRIM) 100 MG tablet   Oral   Take 100 mg by mouth daily.         Marland Kitchen amLODipine (NORVASC) 10 MG tablet   Oral   Take 0.5 tablets (5 mg total) by mouth daily.   30 tablet   11   . fexofenadine (ALLEGRA) 180 MG tablet   Oral   Take 180 mg by mouth daily.         . fluticasone (FLONASE) 50 MCG/ACT nasal spray   Nasal   Place 2 sprays into the nose daily.         . hydrocortisone (ANUSOL-HC) 25 MG suppository   Rectal   Place 1 suppository (25 mg total) rectally 2 (two) times daily.   12 suppository   1   . indomethacin (INDOCIN) 50 MG capsule   Oral   Take 50 mg by mouth 2 (two) times daily with a meal.         . losartan (COZAAR) 100 MG tablet   Oral   Take 1 tablet (100 mg total) by mouth daily.   90 tablet   0   . meloxicam (MOBIC) 15 MG tablet      One tab PO qAM with breakfast for 2 weeks, then daily prn pain.   30 tablet   3   . metoprolol succinate (TOPROL-XL) 25  MG 24 hr tablet   Oral   Take 25 mg by mouth daily.         Marland Kitchen. omeprazole (PRILOSEC) 20 MG capsule   Oral   Take 1 capsule (20 mg total) by mouth daily.   30 capsule   2   . predniSONE (STERAPRED UNI-PAK) 10 MG tablet      12 day taper pack, use as directed   1 tablet   0   . testosterone cypionate (DEPOTESTOTERONE CYPIONATE) 200 MG/ML injection   Intramuscular   Inject 1 mL (200 mg total) into the muscle every 14 (fourteen) days.   10 mL   0   . vardenafil (LEVITRA) 20 MG tablet   Oral   Take 1 tablet (20 mg total) by mouth as needed for erectile dysfunction.   30 tablet   11    BP 160/101  Pulse 97  Temp(Src) 97.8 F (36.6 C) (Oral)  Resp 24  Ht 5\' 7"  (1.702 m)  Wt 225 lb (102.059 kg)  BMI 35.23 kg/m2  SpO2 98% Physical Exam  Nursing note and vitals reviewed. Constitutional: He is oriented to person, place, and time. He appears well-developed and well-nourished. No distress.  HENT:  Head: Normocephalic.  Eyes: Conjunctivae are normal.  Pupils are equal, round, and reactive to light.  Cardiovascular: Normal heart sounds.   Pulmonary/Chest: Breath sounds normal.  Abdominal: Soft. There is no tenderness.  Genitourinary: Rectal exam shows external hemorrhoid. Rectal exam shows no fissure.     At the posterior edge of anus, as noted on diagram, is a non-thrombosed hemorrhoid, about 5mm by 1cm, tender to palpation.  The anal verge just proximal to this hemorroid is friable and tender to palpation  Neurological: He is alert and oriented to person, place, and time.  Skin: Skin is warm and dry.    ED Course  Procedures  none      MDM   1. External hemorrhoid; not thrombosed    Begin Anusol HC suppositories BID. Obtain a "doughnut" pad for sitting.  Avoid constipation; use a stool softener. Followup with Family Doctor if not improved in one week.     Lattie HawStephen A Nikia Mangino, MD 09/09/13 1022

## 2013-09-07 NOTE — ED Notes (Signed)
Patient c/o pain/burning with bowel movement since Monday 09/03/13; states when he wiped yesterday he noticed blood. Has not tried anything OTC.

## 2013-09-07 NOTE — Discharge Instructions (Signed)
Obtain a "doughnut" pad for sitting.  Avoid constipation; use a stool softener.   Hemorrhoids Hemorrhoids are swollen veins around the rectum or anus. There are two types of hemorrhoids:   Internal hemorrhoids. These occur in the veins just inside the rectum. They may poke through to the outside and become irritated and painful.  External hemorrhoids. These occur in the veins outside the anus and can be felt as a painful swelling or hard lump near the anus. CAUSES  Pregnancy.   Obesity.   Constipation or diarrhea.   Straining to have a bowel movement.   Sitting for long periods on the toilet.  Heavy lifting or other activity that caused you to strain.  Anal intercourse. SYMPTOMS   Pain.   Anal itching or irritation.   Rectal bleeding.   Fecal leakage.   Anal swelling.   One or more lumps around the anus.  DIAGNOSIS  Your caregiver may be able to diagnose hemorrhoids by visual examination. Other examinations or tests that may be performed include:   Examination of the rectal area with a gloved hand (digital rectal exam).   Examination of anal canal using a small tube (scope).   A blood test if you have lost a significant amount of blood.  A test to look inside the colon (sigmoidoscopy or colonoscopy). TREATMENT Most hemorrhoids can be treated at home. However, if symptoms do not seem to be getting better or if you have a lot of rectal bleeding, your caregiver may perform a procedure to help make the hemorrhoids get smaller or remove them completely. Possible treatments include:   Placing a rubber band at the base of the hemorrhoid to cut off the circulation (rubber band ligation).   Injecting a chemical to shrink the hemorrhoid (sclerotherapy).   Using a tool to burn the hemorrhoid (infrared light therapy).   Surgically removing the hemorrhoid (hemorrhoidectomy).   Stapling the hemorrhoid to block blood flow to the tissue (hemorrhoid stapling).   HOME CARE INSTRUCTIONS   Eat foods with fiber, such as whole grains, beans, nuts, fruits, and vegetables. Ask your doctor about taking products with added fiber in them (fibersupplements).  Increase fluid intake. Drink enough water and fluids to keep your urine clear or pale yellow.   Exercise regularly.   Go to the bathroom when you have the urge to have a bowel movement. Do not wait.   Avoid straining to have bowel movements.   Keep the anal area dry and clean. Use wet toilet paper or moist towelettes after a bowel movement.   Medicated creams and suppositories may be used or applied as directed.   Only take over-the-counter or prescription medicines as directed by your caregiver.   Take warm sitz baths for 15 20 minutes, 3 4 times a day to ease pain and discomfort.   Place ice packs on the hemorrhoids if they are tender and swollen. Using ice packs between sitz baths may be helpful.   Put ice in a plastic bag.   Place a towel between your skin and the bag.   Leave the ice on for 15 20 minutes, 3 4 times a day.   Do not use a donut-shaped pillow or sit on the toilet for long periods. This increases blood pooling and pain.  SEEK MEDICAL CARE IF:  You have increasing pain and swelling that is not controlled by treatment or medicine.  You have uncontrolled bleeding.  You have difficulty or you are unable to have a bowel movement.  You have pain or inflammation outside the area of the hemorrhoids. MAKE SURE YOU:  Understand these instructions.  Will watch your condition.  Will get help right away if you are not doing well or get worse. Document Released: 06/18/2000 Document Revised: 06/07/2012 Document Reviewed: 04/25/2012 [facility] Patient Information 2014 [place], [place].

## 2013-09-10 ENCOUNTER — Encounter: Payer: Self-pay | Admitting: *Deleted

## 2013-09-10 ENCOUNTER — Ambulatory Visit (INDEPENDENT_AMBULATORY_CARE_PROVIDER_SITE_OTHER): Payer: BC Managed Care – PPO | Admitting: Sports Medicine

## 2013-09-10 DIAGNOSIS — E291 Testicular hypofunction: Secondary | ICD-10-CM

## 2013-09-10 MED ORDER — TESTOSTERONE CYPIONATE 200 MG/ML IM SOLN
200.0000 mg | INTRAMUSCULAR | Status: DC
  Administered 2013-09-10: 200 mg via INTRAMUSCULAR

## 2013-09-10 NOTE — Assessment & Plan Note (Signed)
Testosterone injection as above. 

## 2013-09-10 NOTE — Progress Notes (Signed)
   Subjective:    Patient ID: Eduardo Mclean, male    DOB: [birth year], 53 y.o.   MRN: [id number]  HPI    Review of Systems     Objective:   Physical Exam        Assessment & Plan:  Testosterone injection given RUOQ without complications.  Pt tolerated well. .kfg

## 2013-09-11 ENCOUNTER — Encounter: Payer: Self-pay | Admitting: Sports Medicine

## 2013-09-24 ENCOUNTER — Encounter: Payer: Self-pay | Admitting: *Deleted

## 2013-09-24 ENCOUNTER — Ambulatory Visit (INDEPENDENT_AMBULATORY_CARE_PROVIDER_SITE_OTHER): Payer: BC Managed Care – PPO | Admitting: Sports Medicine

## 2013-09-24 VITALS — BP 132/88 | HR 70 | Wt 231.0 lb

## 2013-09-24 DIAGNOSIS — E291 Testicular hypofunction: Secondary | ICD-10-CM

## 2013-09-24 MED ORDER — TESTOSTERONE CYPIONATE 200 MG/ML IM SOLN
200.0000 mg | Freq: Once | INTRAMUSCULAR | Status: AC
  Administered 2013-09-24: 200 mg via INTRAMUSCULAR

## 2013-09-24 NOTE — Progress Notes (Signed)
   Subjective:    Patient ID: Sloan LeiterJames Dickens, male    DOB: 1960-11-22, 53 y.o.   MRN: [id number] Testosterone injection given IM LUOQ. No complications. Donne AnonAmber Vici Novick, CMA HPI    Review of Systems     Objective:   Physical Exam        Assessment & Plan:

## 2013-09-24 NOTE — Assessment & Plan Note (Signed)
Testosterone injection as above. 

## 2013-10-08 ENCOUNTER — Ambulatory Visit: Payer: BC Managed Care – PPO

## 2013-10-11 ENCOUNTER — Ambulatory Visit (INDEPENDENT_AMBULATORY_CARE_PROVIDER_SITE_OTHER): Payer: BC Managed Care – PPO | Admitting: Sports Medicine

## 2013-10-11 ENCOUNTER — Encounter: Payer: Self-pay | Admitting: *Deleted

## 2013-10-11 VITALS — BP 137/89 | HR 81 | Wt 225.0 lb

## 2013-10-11 DIAGNOSIS — E291 Testicular hypofunction: Secondary | ICD-10-CM

## 2013-10-11 MED ORDER — TESTOSTERONE CYPIONATE 200 MG/ML IM SOLN
200.0000 mg | Freq: Once | INTRAMUSCULAR | Status: AC
  Administered 2013-10-11: 200 mg via INTRAMUSCULAR

## 2013-10-11 NOTE — Assessment & Plan Note (Signed)
Testosterone injection as below.

## 2013-10-11 NOTE — Progress Notes (Signed)
Testosterone injection given. 

## 2013-10-15 ENCOUNTER — Ambulatory Visit: Payer: BC Managed Care – PPO

## 2013-10-16 ENCOUNTER — Other Ambulatory Visit: Payer: Self-pay | Admitting: Sports Medicine

## 2013-10-22 ENCOUNTER — Other Ambulatory Visit: Payer: Self-pay | Admitting: Sports Medicine

## 2013-10-22 MED ORDER — ALLOPURINOL 100 MG PO TABS: 100.0000 mg | ORAL_TABLET | Freq: Every day | ORAL | Status: DC

## 2013-10-22 MED ORDER — METOPROLOL TARTRATE 25 MG PO TABS: 25.0000 mg | ORAL_TABLET | Freq: Two times a day (BID) | ORAL | Status: DC

## 2013-10-23 ENCOUNTER — Other Ambulatory Visit: Payer: Self-pay

## 2013-10-24 ENCOUNTER — Telehealth: Payer: Self-pay

## 2013-10-24 MED ORDER — ALLOPURINOL 300 MG PO TABS: 300.0000 mg | ORAL_TABLET | Freq: Every day | ORAL | Status: DC

## 2013-10-24 NOTE — Telephone Encounter (Signed)
Patient called stated that he had wrong Rx dosage  for Allopurinol called in to his pharmacy. The correct dosage is 300 mg and I sent a refill to [facility] . Rhonda Cunningham,CMA

## 2013-10-29 ENCOUNTER — Ambulatory Visit: Payer: BC Managed Care – PPO

## 2013-11-05 ENCOUNTER — Ambulatory Visit (INDEPENDENT_AMBULATORY_CARE_PROVIDER_SITE_OTHER): Payer: BC Managed Care – PPO | Admitting: Sports Medicine

## 2013-11-05 VITALS — BP 132/82 | HR 79 | Resp 16 | Wt 219.0 lb

## 2013-11-05 DIAGNOSIS — E291 Testicular hypofunction: Secondary | ICD-10-CM

## 2013-11-05 MED ORDER — TESTOSTERONE CYPIONATE 100 MG/ML IM SOLN
200.0000 mg | Freq: Once | INTRAMUSCULAR | Status: AC
  Administered 2013-11-05: 200 mg via INTRAMUSCULAR

## 2013-11-05 NOTE — Assessment & Plan Note (Signed)
At this point we are going to check testosterone levels, I would like them obtained exactly one week after today.

## 2013-11-05 NOTE — Progress Notes (Signed)
   Subjective:    Patient ID: Eduardo LeiterJames Slinger, male    DOB: [birth year], 53 y.o.   MRN: [id number]  HPI Patient is here for testosterone 200 mg injection. Denies chest pain, shortness of breath, headaches or mood changes.    Review of Systems     Objective:   Physical Exam        Assessment & Plan:  200 mg testosterone / Patient tolerated injection well.

## 2013-11-19 ENCOUNTER — Ambulatory Visit (INDEPENDENT_AMBULATORY_CARE_PROVIDER_SITE_OTHER): Payer: BC Managed Care – PPO | Admitting: Sports Medicine

## 2013-11-19 ENCOUNTER — Encounter: Payer: Self-pay | Admitting: Sports Medicine

## 2013-11-19 VITALS — BP 116/73 | HR 59 | Ht 67.0 in | Wt 221.0 lb

## 2013-11-19 DIAGNOSIS — E291 Testicular hypofunction: Secondary | ICD-10-CM

## 2013-11-19 DIAGNOSIS — R1310 Dysphagia, unspecified: Secondary | ICD-10-CM

## 2013-11-19 DIAGNOSIS — I10 Essential (primary) hypertension: Secondary | ICD-10-CM

## 2013-11-19 MED ORDER — TESTOSTERONE CYPIONATE 200 MG/ML IM SOLN
200.0000 mg | INTRAMUSCULAR | Status: DC
  Administered 2013-11-19: 200 mg via INTRAMUSCULAR

## 2013-11-19 NOTE — Assessment & Plan Note (Signed)
Well controlled. No changes. 

## 2013-11-19 NOTE — Assessment & Plan Note (Signed)
Testosterone injection as above. Recheck levels in exactly one week.

## 2013-11-19 NOTE — Progress Notes (Signed)
  Subjective:    CC: Followup  HPI: Male hypogonadism: Currently getting testosterone injections, he has not yet had his blood work checked.  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 Symptoms are moderate, persistent, no further neck symptoms. He smokes occasionally.  Past medical history, Surgical history, Family history not pertinant except as noted below, Social history, Allergies, and medications have been entered into the medical record, reviewed, and no changes needed.   Review of Systems: No fevers, chills, night sweats, weight loss, chest pain, or shortness of breath.   Objective:    General: Well Developed, well nourished, and in no acute distress.  Neuro: Alert and oriented x3, extra-ocular muscles intact, sensation grossly intact.  HEENT: Normocephalic, atraumatic, pupils equal round reactive to light, neck supple, no masses, no lymphadenopathy, thyroid nonpalpable.  Skin: Warm and dry, no rashes. Cardiac: Regular rate and rhythm, no murmurs rubs or gallops, no lower extremity edema.  Respiratory: Clear to auscultation bilaterally. Not using accessory muscles, speaking in full sentences.  Impression and Recommendations:

## 2013-11-19 NOTE — Assessment & Plan Note (Addendum)
Went to the emergency department, it sounds as though a flexible laryngoscopy was performed. 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 He is also post ACDF. At this point we are going to obtain a barium swallow upper GI series, and refer to ear nose and throat.

## 2013-11-22 ENCOUNTER — Other Ambulatory Visit (HOSPITAL_COMMUNITY): Payer: Self-pay | Admitting: Sports Medicine

## 2013-11-22 DIAGNOSIS — R1310 Dysphagia, unspecified: Secondary | ICD-10-CM

## 2013-11-28 LAB — CBC
HCT: 45.8 % (ref 39.0–52.0)
Hemoglobin: 15.1 g/dL (ref 13.0–17.0)
MCH: 29.1 pg (ref 26.0–34.0)
MCHC: 33 g/dL (ref 30.0–36.0)
MCV: 88.2 fL (ref 78.0–100.0)
Platelets: 186 10*3/uL (ref 150–400)
RBC: 5.19 MIL/uL (ref 4.22–5.81)
RDW: 14.6 % (ref 11.5–15.5)
WBC: 7.2 K/uL (ref 4.0–10.5)

## 2013-11-28 LAB — PSA, TOTAL AND FREE
PSA, Free Pct: 37 % (ref >25)
PSA, Free: 0.59 ng/mL
PSA: 1.59 ng/mL (ref <=4.00)

## 2013-11-28 LAB — TESTOSTERONE: Testosterone: 492 ng/dL (ref 300–890)

## 2013-12-03 ENCOUNTER — Ambulatory Visit (HOSPITAL_COMMUNITY): Payer: BC Managed Care – PPO

## 2013-12-03 ENCOUNTER — Inpatient Hospital Stay (HOSPITAL_COMMUNITY): Admission: RE | Admit: 2013-12-03 | Payer: BC Managed Care – PPO | Source: Ambulatory Visit

## 2013-12-04 ENCOUNTER — Encounter: Payer: Self-pay | Admitting: Sports Medicine

## 2013-12-04 ENCOUNTER — Ambulatory Visit (HOSPITAL_COMMUNITY)
Admission: RE | Admit: 2013-12-04 | Discharge: 2013-12-04 | Disposition: A | Discharge status: Home / Self Care | Payer: BC Managed Care – PPO | Source: Ambulatory Visit | Attending: Sports Medicine | Admitting: Sports Medicine

## 2013-12-04 ENCOUNTER — Ambulatory Visit (INDEPENDENT_AMBULATORY_CARE_PROVIDER_SITE_OTHER): Payer: BC Managed Care – PPO | Admitting: Sports Medicine

## 2013-12-04 VITALS — BP 140/86 | HR 65

## 2013-12-04 DIAGNOSIS — M109 Gout, unspecified: Secondary | ICD-10-CM | POA: Insufficient documentation

## 2013-12-04 DIAGNOSIS — R1310 Dysphagia, unspecified: Secondary | ICD-10-CM | POA: Insufficient documentation

## 2013-12-04 DIAGNOSIS — K219 Gastro-esophageal reflux disease without esophagitis: Secondary | ICD-10-CM | POA: Insufficient documentation

## 2013-12-04 DIAGNOSIS — E291 Testicular hypofunction: Secondary | ICD-10-CM

## 2013-12-04 DIAGNOSIS — I10 Essential (primary) hypertension: Secondary | ICD-10-CM | POA: Insufficient documentation

## 2013-12-04 MED ORDER — TESTOSTERONE CYPIONATE 100 MG/ML IM SOLN
200.0000 mg | Freq: Once | INTRAMUSCULAR | Status: AC
  Administered 2013-12-04: 200 mg via INTRAMUSCULAR

## 2013-12-04 NOTE — Assessment & Plan Note (Signed)
Testosterone injection as above. 

## 2013-12-04 NOTE — Procedures (Signed)
Objective Swallowing Evaluation: Modified Barium Swallowing Study  Patient Details  Name: Eduardo Mclean MRN: [id number] Date of Birth: 1961-04-04  Today's Date: 12/04/2013 Time: 1142-1201 SLP Time Calculation (min): 19 min  Past Medical History:  Past Medical History  Diagnosis Date  . GERD (gastroesophageal reflux disease)   . Gout   . Hypertension   . Seasonal allergies    Past Surgical History:  Past Surgical History  Procedure Laterality Date  . Back surgery     HPI:  53 yo male with PMH significant for GERD and ACDF C5-7, presenting as an outpatient with c/o food getting stuck in his throat.     Assessment / Plan / Recommendation Clinical Impression  Dysphagia Diagnosis: Within Functional Limits;Suspected primary esophageal dysphagia  Clinical impression: Pt's oropharyngeal swallow is WFL as observed during this study. Pt appeared to have slow transit of the barium tablet through the proximal esophagus, which easily cleared with an additional liquid wash (MD not present to confirm). SLP encouraged pt to utilize small bites/sips, thoroughly masticate food, and alternate solids/liquids in order to facilitate esophageal clearance. If symptoms persists, MD may wish to further assess possible esophageal component.    Treatment Recommendation  No treatment recommended at this time    Diet Recommendation Regular;Thin liquid   Liquid Administration via: Cup;Straw Medication Administration: Whole meds with liquid Supervision: Patient able to self feed Compensations: Small sips/bites;Slow rate;Follow solids with liquid Postural Changes and/or Swallow Maneuvers: Seated upright 90 degrees;Upright 30-60 min after meal    Other  Recommendations Recommended Consults: Consider esophageal assessment Oral Care Recommendations: Oral care BID   Follow Up Recommendations  None    Frequency and Duration        Pertinent Vitals/Pain N/A    SLP Swallow Goals     General Date of  Onset:  (December 2013) HPI: 53 yo male with PMH significant for GERD and ACDF C5-7, presenting as an outpatient with c/o food getting stuck in his throat. Type of Study: Modified Barium Swallowing Study Reason for Referral: Objectively evaluate swallowing function Previous Swallow Assessment: none in chart Diet Prior to this Study: Regular;Thin liquids Temperature Spikes Noted: N/A Respiratory Status: Room air History of Recent Intubation: No Behavior/Cognition: Alert;Cooperative;Pleasant mood Oral Cavity - Dentition: Adequate natural dentition Oral Motor / Sensory Function: Within functional limits Self-Feeding Abilities: Able to feed self Patient Positioning: Upright in chair Baseline Vocal Quality: Clear Volitional Cough: Strong Volitional Swallow: Able to elicit Anatomy: Other (Comment) (surgical hardware noted C5-7, suspected osteophyte C4-5 (MD not present to confirm)) Pharyngeal Secretions: Not observed secondary MBS    Reason for Referral Objectively evaluate swallowing function   Oral Phase Oral Preparation/Oral Phase Oral Phase: WFL   Pharyngeal Phase Pharyngeal Phase Pharyngeal Phase: Within functional limits  Cervical Esophageal Phase    GO    Cervical Esophageal Phase Cervical Esophageal Phase: [facility]    Functional Assessment Tool Used: skilled clinical judgment Functional Limitations: Swallowing Swallow Current Status (T5573): At least 1 percent but less than 20 percent impaired, limited or restricted Swallow Goal Status [phone]): At least 1 percent but less than 20 percent impaired, limited or restricted Swallow Discharge Status [phone]): At least 1 percent but less than 20 percent impaired, limited or restricted      Maxcine Ham, M.A. CCC-SLP [phone]  Maxcine Ham 12/04/2013, 12:24 PM

## 2013-12-04 NOTE — Progress Notes (Signed)
   Subjective:    Patient ID: Eduardo Mclean, male    DOB: 1961/01/18, 53 y.o.   MRN: [id number]  HPI  Eduardo Mclean is here for testosterone injection.Denies mood changes,chest pains and headaches or shortness of breath.   Review of Systems     Objective:   Physical Exam        Assessment & Plan:  Patient tolerated injection well. Advised to return in 2 weeks.

## 2013-12-08 ENCOUNTER — Other Ambulatory Visit: Payer: Self-pay | Admitting: Family Medicine

## 2013-12-17 ENCOUNTER — Encounter: Payer: Self-pay | Admitting: Sports Medicine

## 2013-12-17 ENCOUNTER — Ambulatory Visit (INDEPENDENT_AMBULATORY_CARE_PROVIDER_SITE_OTHER): Payer: BC Managed Care – PPO | Admitting: Sports Medicine

## 2013-12-17 VITALS — BP 152/78 | HR 66 | Ht 67.0 in | Wt 217.0 lb

## 2013-12-17 DIAGNOSIS — R1310 Dysphagia, unspecified: Secondary | ICD-10-CM

## 2013-12-17 DIAGNOSIS — E291 Testicular hypofunction: Secondary | ICD-10-CM

## 2013-12-17 DIAGNOSIS — I10 Essential (primary) hypertension: Secondary | ICD-10-CM

## 2013-12-17 MED ORDER — TESTOSTERONE CYPIONATE 200 MG/ML IM SOLN
200.0000 mg | INTRAMUSCULAR | Status: DC
  Administered 2013-12-17: 200 mg via INTRAMUSCULAR

## 2013-12-17 NOTE — Assessment & Plan Note (Signed)
Good response, feels better in terms of energy and libido. Testosterone levels were in the upper 400s. He can recheck in 6 months along with CBC.

## 2013-12-17 NOTE — Assessment & Plan Note (Signed)
Elevated, we will keep an eye on this. It has been well controlled in the past on the same medication regimen.

## 2013-12-17 NOTE — Assessment & Plan Note (Signed)
Swallow study did show delayed passage of the barium tablet through the pharynx. It was recommended that he take small sips, chew well, and follow with water. This was discussed in detail.

## 2013-12-17 NOTE — Progress Notes (Signed)
  Subjective:    CC: Follow up  HPI: Male hypogonadism : noting improvement in libido and fatigue.  Hypertension: Has been well controlled in the past, elevated today but headaches, visual changes, chest pain.  Dysphagia: Swallow study was normal with the exception of delayed barium passage through the upper pharynx. It was recommended that he do small bites, masticate, and wash with water after every bite.  Past medical history, Surgical history, Family history not pertinant except as noted below, Social history, Allergies, and medications have been entered into the medical record, reviewed, and no changes needed.   Review of Systems: No fevers, chills, night sweats, weight loss, chest pain, or shortness of breath.   Objective:    General: Well Developed, well nourished, and in no acute distress.  Neuro: Alert and oriented x3, extra-ocular muscles intact, sensation grossly intact.  HEENT: Normocephalic, atraumatic, pupils equal round reactive to light, neck supple, no masses, no lymphadenopathy, thyroid nonpalpable.  Skin: Warm and dry, no rashes. Cardiac: Regular rate and rhythm, no murmurs rubs or gallops, no lower extremity edema.  Respiratory: Clear to auscultation bilaterally. Not using accessory muscles, speaking in full sentences.  Impression and Recommendations:

## 2013-12-31 ENCOUNTER — Ambulatory Visit (INDEPENDENT_AMBULATORY_CARE_PROVIDER_SITE_OTHER): Payer: BC Managed Care – PPO | Admitting: Sports Medicine

## 2013-12-31 VITALS — BP 147/94 | HR 68 | Ht 67.0 in | Wt 215.0 lb

## 2013-12-31 DIAGNOSIS — E291 Testicular hypofunction: Secondary | ICD-10-CM

## 2013-12-31 MED ORDER — TESTOSTERONE CYPIONATE 200 MG/ML IM SOLN
200.0000 mg | Freq: Once | INTRAMUSCULAR | Status: AC
  Administered 2013-12-31: 200 mg via INTRAMUSCULAR

## 2013-12-31 NOTE — Progress Notes (Signed)
   Subjective:    Patient ID: Eduardo Mclean, male    DOB: 1961/06/04, 53 y.o.   MRN: [id number]  HPI Patient presents today for testosterone injection which he received on his RUOQ without complication. Patient denies chest pain, SOB, headaches or mood swings at this time.    Review of Systems     Objective:   Physical Exam        Assessment & Plan:

## 2013-12-31 NOTE — Assessment & Plan Note (Signed)
Testosterone injection as above. 

## 2014-01-12 ENCOUNTER — Other Ambulatory Visit: Payer: Self-pay | Admitting: Sports Medicine

## 2014-01-14 ENCOUNTER — Ambulatory Visit (INDEPENDENT_AMBULATORY_CARE_PROVIDER_SITE_OTHER): Payer: BC Managed Care – PPO | Admitting: Sports Medicine

## 2014-01-14 VITALS — BP 146/90 | HR 82 | Temp 98.2°F | Ht 67.0 in | Wt 221.0 lb

## 2014-01-14 DIAGNOSIS — E291 Testicular hypofunction: Secondary | ICD-10-CM

## 2014-01-14 MED ORDER — TESTOSTERONE CYPIONATE 100 MG/ML IM SOLN
200.0000 mg | Freq: Once | INTRAMUSCULAR | Status: AC
  Administered 2014-01-14: 200 mg via INTRAMUSCULAR

## 2014-01-14 NOTE — Assessment & Plan Note (Signed)
Testosterone injection as above. 

## 2014-01-14 NOTE — Progress Notes (Signed)
   Subjective:    Patient ID: Eduardo Mclean, male    DOB: Sep 29, 1960, 53 y.o.   MRN: [id number]  HPI    Review of Systems     Objective:   Physical Exam        Assessment & Plan:  Pt came in for testosterone injections today. He tolerated well. Given in LUOQ (200mg ). Denies cp, ha, palpitatins, mood changes or abnormal bleeding.

## 2014-01-28 ENCOUNTER — Ambulatory Visit (INDEPENDENT_AMBULATORY_CARE_PROVIDER_SITE_OTHER): Payer: BC Managed Care – PPO | Admitting: Sports Medicine

## 2014-01-28 VITALS — BP 139/91 | HR 58 | Temp 98.1°F | Ht 67.0 in | Wt 218.0 lb

## 2014-01-28 DIAGNOSIS — E291 Testicular hypofunction: Secondary | ICD-10-CM

## 2014-01-28 MED ORDER — TESTOSTERONE CYPIONATE 200 MG/ML IM SOLN
200.0000 mg | Freq: Once | INTRAMUSCULAR | Status: AC
  Administered 2014-01-28: 200 mg via INTRAMUSCULAR

## 2014-01-28 NOTE — Progress Notes (Signed)
   Subjective:    Patient ID: Sloan LeiterJames Higley, male    DOB: February 18, 1961, 53 y.o.   MRN: [id number]  HPI    Review of Systems     Objective:   Physical Exam        Assessment & Plan:  Pt came in today for his testosterone injection and he received 200 mg (1 cc) RUOQ. Pt denies cp, ha, palpitation or mood changes,./Trai Ells,CMA

## 2014-01-28 NOTE — Assessment & Plan Note (Signed)
Testosterone injection as above. 

## 2014-02-11 ENCOUNTER — Other Ambulatory Visit: Payer: Self-pay | Admitting: Sports Medicine

## 2014-02-11 ENCOUNTER — Ambulatory Visit: Payer: BC Managed Care – PPO

## 2014-02-12 ENCOUNTER — Other Ambulatory Visit: Payer: Self-pay

## 2014-02-12 ENCOUNTER — Ambulatory Visit (INDEPENDENT_AMBULATORY_CARE_PROVIDER_SITE_OTHER): Payer: BC Managed Care – PPO | Admitting: Sports Medicine

## 2014-02-12 VITALS — BP 148/95 | HR 72 | Ht 67.0 in | Wt 217.0 lb

## 2014-02-12 DIAGNOSIS — E291 Testicular hypofunction: Secondary | ICD-10-CM | POA: Diagnosis not present

## 2014-02-12 MED ORDER — OMEPRAZOLE 20 MG PO CPDR: DELAYED_RELEASE_CAPSULE | ORAL | Status: DC

## 2014-02-12 MED ORDER — TESTOSTERONE CYPIONATE 200 MG/ML IM SOLN
200.0000 mg | Freq: Once | INTRAMUSCULAR | Status: AC
  Administered 2014-02-12: 200 mg via INTRAMUSCULAR

## 2014-02-12 NOTE — Telephone Encounter (Signed)
Patient request a refill for Omeprazole 20 mg #30 5 R sent to [facility]. Amellia Panik,CMA

## 2014-02-12 NOTE — Assessment & Plan Note (Signed)
Testosterone injection as above. 

## 2014-02-12 NOTE — Progress Notes (Signed)
   Subjective:    Patient ID: Eduardo Mclean, male    DOB: 1961-07-04, 53 y.o.   MRN: [id number]  HPI Patient presents today for testosterone injection which he rec'd in his LUOQ without complication. Patient denies mood changes, CP/SOB or headaches at this time. Patient was reminded to f/u in 2 weeks.    Review of Systems     Objective:   Physical Exam        Assessment & Plan:

## 2014-02-20 ENCOUNTER — Encounter: Payer: Self-pay | Admitting: Emergency Medicine

## 2014-02-20 ENCOUNTER — Emergency Department
Admission: EM | Admit: 2014-02-20 | Discharge: 2014-02-20 | Disposition: A | Discharge status: Home / Self Care | Payer: BC Managed Care – PPO | Source: Home / Self Care | Attending: Emergency Medicine | Admitting: Emergency Medicine

## 2014-02-20 ENCOUNTER — Emergency Department (INDEPENDENT_AMBULATORY_CARE_PROVIDER_SITE_OTHER): Payer: BC Managed Care – PPO

## 2014-02-20 DIAGNOSIS — M10071 Idiopathic gout, right ankle and foot: Secondary | ICD-10-CM

## 2014-02-20 DIAGNOSIS — M109 Gout, unspecified: Secondary | ICD-10-CM

## 2014-02-20 DIAGNOSIS — M25571 Pain in right ankle and joints of right foot: Secondary | ICD-10-CM

## 2014-02-20 DIAGNOSIS — M25579 Pain in unspecified ankle and joints of unspecified foot: Secondary | ICD-10-CM

## 2014-02-20 MED ORDER — HYDROCODONE-ACETAMINOPHEN 5-325 MG PO TABS
1.0000 | ORAL_TABLET | ORAL | Status: DC | PRN
Start: 2014-02-20 — End: 2014-04-19

## 2014-02-20 MED ORDER — PREDNISONE 10 MG PO TABS: ORAL_TABLET | ORAL | Status: DC

## 2014-02-20 NOTE — ED Notes (Signed)
Pt c/o RT ankle pain x 1 month, worse x last night. Denies injury. He applied an Ace wrap with some relief.

## 2014-02-20 NOTE — ED Provider Notes (Signed)
CSN: [id number]     Arrival date & time 02/20/14  1722 History   First MD Initiated Contact with Patient 02/20/14 1724     Chief Complaint  Patient presents with  . Ankle Pain    HPI Right ankle pain diffusely for the past 4-5 months. He's not sure of any specific injury although he feels walking on it can flare it up.  Can swell with the pain and then pain came resolve on its own after a week or so. Over the past week, right ankle pain that's sharp and dull, diffusely right ankle anterior medial and laterally . He feels he might have twisted it or just flared up by walking excessively the past day or 2. Now the right ankle pain is 7/10 dull, achy, constant. He tried using OTC ankle wrap with some relief. Denies fever or chills or systemic symptoms. No nausea or vomiting.  Past medical history of gout. He takes allopurinol daily and faithfully as prescribed, and he states he hasn't had a flareup in about 2 years.  In the past, prednisone helped acute gout flareup Past Medical History  Diagnosis Date  . GERD (gastroesophageal reflux disease)   . Gout   . Hypertension   . Seasonal allergies    Past Surgical History  Procedure Laterality Date  . Back surgery     Family History  Problem Relation Age of Onset  . Hypertension Father    History  Substance Use Topics  . Smoking status: Current Some Day Smoker  . Smokeless tobacco: Never Used  . Alcohol Use: Yes    Review of Systems  All other systems reviewed and are negative.   Allergies  Review of patient's allergies indicates no known allergies.  Home Medications   Prior to Admission medications   Medication Sig Start Date End Date Taking? Authorizing Provider  allopurinol (ZYLOPRIM) 300 MG tablet Take 1 tablet (300 mg total) by mouth daily. 10/24/13   Monica Becton, MD  amLODipine (NORVASC) 10 MG tablet Take 0.5 tablets (5 mg total) by mouth daily. 06/19/13   Monica Becton, MD  fexofenadine (ALLEGRA) 180  MG tablet Take 180 mg by mouth daily.    Historical Provider, MD  fluticasone (FLONASE) 50 MCG/ACT nasal spray Place 2 sprays into the nose daily.    Historical Provider, MD  HYDROcodone-acetaminophen (NORCO/VICODIN) 5-325 MG per tablet Take 1-2 tablets by mouth every 4 (four) hours as needed for severe pain. Take with food. 02/20/14   Lajean Manes, MD  hydrocortisone (ANUSOL-HC) 25 MG suppository Place 1 suppository (25 mg total) rectally 2 (two) times daily. 09/07/13   Lattie Haw, MD  indomethacin (INDOCIN) 50 MG capsule Take 50 mg by mouth 2 (two) times daily with a meal.    Historical Provider, MD  losartan (COZAAR) 100 MG tablet TAKE ONE TABLET BY MOUTH ONE TIME DAILY     Monica Becton, MD  meloxicam (MOBIC) 15 MG tablet One tab PO qAM with breakfast for 2 weeks, then daily prn pain. 07/16/13   Monica Becton, MD  metoprolol tartrate (LOPRESSOR) 25 MG tablet Take 1 tablet (25 mg total) by mouth 2 (two) times daily. 10/22/13   Monica Becton, MD  omeprazole (PRILOSEC) 20 MG capsule TAKE ONE CAPSULE BY MOUTH ONE TIME DAILY 02/12/14   Monica Becton, MD  predniSONE (DELTASONE) 10 MG tablet Take as directed for 6 days.--Take 6 on day 1, 5 on day 2, 4 on day 3, then 3  tablets on day 4, then 2 tablets on day 5, then 1 on day 6. 02/20/14   Lajean Manesavid Massey, MD  testosterone cypionate (DEPOTESTOTERONE CYPIONATE) 200 MG/ML injection Inject 1 mL (200 mg total) into the muscle every 14 (fourteen) days. 08/14/13   Monica Bectonhomas J Thekkekandam, MD  vardenafil (LEVITRA) 20 MG tablet Take 1 tablet (20 mg total) by mouth as needed for erectile dysfunction. 07/16/13 07/16/14  Monica Bectonhomas J Thekkekandam, MD   BP 145/85  Pulse 62  Temp(Src) 98 F (36.7 C) (Oral)  Resp 18  Ht 5\' 7"  (1.702 m)  Wt 221 lb (100.245 kg)  BMI 34.61 kg/m2  SpO2 96% Physical Exam  Nursing note and vitals reviewed. Constitutional: He is oriented to person, place, and time. He appears well-developed and well-nourished. No  distress.  Uncomfortable from right ankle pain. He walks favoring right ankle, but he is able to weight-bear with some discomfort  HENT:  Head: Normocephalic and atraumatic.  Eyes: Conjunctivae and EOM are normal. Pupils are equal, round, and reactive to light. No scleral icterus.  Neck: Normal range of motion.  Cardiovascular: Normal rate.   Pulmonary/Chest: Effort normal.  Abdominal: He exhibits no distension.  Musculoskeletal: Normal range of motion.       Right ankle: He exhibits swelling. Tenderness. Lateral malleolus and medial malleolus tenderness found. No head of 5th metatarsal tenderness found. Achilles tendon normal. Achilles tendon exhibits normal Thompson's test results.       Right foot: Normal. He exhibits no tenderness, no bony tenderness and no swelling.       Feet:  Right ankle: Diffuse tenderness and mild swelling and minimal warmth. No ecchymosis or skin change. Range of motion decreased. Pain exacerbated especially by dorsi flexion. No ligamentous instability. No open wound.  Neuro neurovascular distally intact  Neurological: He is alert and oriented to person, place, and time.  Skin: Skin is warm.  Psychiatric: He has a normal mood and affect.    ED Course  Procedures (including critical care time) Labs Review Labs Reviewed  URIC ACID    Imaging Review Dg Ankle Complete Right  02/20/2014   CLINICAL DATA:  Ankle pain.  EXAM: RIGHT ANKLE - COMPLETE 3+ VIEW  COMPARISON:  None.  FINDINGS: The ankle mortise is maintained. No acute fracture or osteochondral abnormality. Mild pes planus. Minimal spurring at the Achilles tendon attachment site.  IMPRESSION: No acute bony findings.   Electronically Signed   By: Loralie ChampagneMark  Gallerani M.D.   On: 02/20/2014 18:30     MDM   1. Right ankle pain   2. Acute gout of right ankle, unspecified cause   3. Acute idiopathic gout of right ankle    x-ray right ankle negative. No fracture or dislocation.  Given his prior history of  gout and clinical presentation, most likely has acute gout right ankle. No systemic symptoms. I doubt that he has an infection or osteomyelitis. Treatment options discussed, as well as risks, benefits, alternatives. Patient voiced understanding and agreement with the following plans: Ace bandage applied right ankle which relieved some pain. Draw uric acid level. Prednisone 10 mg-6 day taper burst orally (this has worked well for acute gout in the past) Vicodin as needed for severe pain acutely. Prescription for #12, no refills. Other advice given.  Followup with Dr. Benjamin Stainhekkekandam, appointment made for Monday 02/25/2014 Precautions discussed. Red flags discussed.--emergency room stat if any red flags Questions invited and answered. Patient voiced understanding and agreement.    Lajean Manesavid Massey, MD 02/20/14 [phone]

## 2014-02-21 ENCOUNTER — Telehealth: Payer: Self-pay | Admitting: Emergency Medicine

## 2014-02-21 LAB — URIC ACID: Uric Acid, Serum: 3.4 mg/dL — ABNORMAL LOW (ref 4.0–7.8)

## 2014-02-25 ENCOUNTER — Ambulatory Visit (INDEPENDENT_AMBULATORY_CARE_PROVIDER_SITE_OTHER): Payer: BC Managed Care – PPO | Admitting: Sports Medicine

## 2014-02-25 VITALS — BP 120/70 | HR 60 | Wt 219.0 lb

## 2014-02-25 DIAGNOSIS — E291 Testicular hypofunction: Secondary | ICD-10-CM

## 2014-02-25 MED ORDER — TESTOSTERONE CYPIONATE 200 MG/ML IM SOLN
200.0000 mg | Freq: Once | INTRAMUSCULAR | Status: AC
  Administered 2014-02-25: 200 mg via INTRAMUSCULAR

## 2014-02-25 NOTE — Assessment & Plan Note (Signed)
Testosterone injection as above. 

## 2014-02-25 NOTE — Progress Notes (Signed)
   Subjective:    Patient ID: Eduardo Mclean, male    DOB: Jul 03, 1961, 53 y.o.   MRN: [id number]  HPI  Eduardo Mclean is here for a testosterone injection. Denies chest pain, shortness of breath, headaches or mood changes.   Review of Systems     Objective:   Physical Exam        Assessment & Plan:  Patient tolerated injection well without complications. Advised to schedule next visit for 2 weeks.

## 2014-03-12 ENCOUNTER — Ambulatory Visit (INDEPENDENT_AMBULATORY_CARE_PROVIDER_SITE_OTHER): Payer: BC Managed Care – PPO | Admitting: Sports Medicine

## 2014-03-12 ENCOUNTER — Encounter: Payer: Self-pay | Admitting: Sports Medicine

## 2014-03-12 VITALS — BP 164/101 | HR 63 | Wt 217.0 lb

## 2014-03-12 DIAGNOSIS — I10 Essential (primary) hypertension: Secondary | ICD-10-CM | POA: Diagnosis not present

## 2014-03-12 DIAGNOSIS — M1A9XX0 Chronic gout, unspecified, without tophus (tophi): Secondary | ICD-10-CM

## 2014-03-12 DIAGNOSIS — M1A0790 Idiopathic chronic gout, unspecified ankle and foot, without tophus (tophi): Secondary | ICD-10-CM

## 2014-03-12 DIAGNOSIS — M1A00X0 Idiopathic chronic gout, unspecified site, without tophus (tophi): Secondary | ICD-10-CM | POA: Diagnosis not present

## 2014-03-12 DIAGNOSIS — E291 Testicular hypofunction: Secondary | ICD-10-CM

## 2014-03-12 MED ORDER — TESTOSTERONE CYPIONATE 200 MG/ML IM SOLN
200.0000 mg | INTRAMUSCULAR | Status: DC
  Administered 2014-03-12: 200 mg via INTRAMUSCULAR

## 2014-03-12 NOTE — Progress Notes (Signed)
Patient ID: Eduardo Mclean, male   DOB: [birth year], 53 y.o.   MRN: [id number]  Subjective:    CC: Left ankle pain  HPI: Eduardo Mclean is a very pleasant 53 year old man with history of hypertension and gout who presents with 6 months of left ankle pain. He reports that the dull, diffuse pain has been constant over the past 6 months, with baseline 2/10 pain that worsens with dorsiflexion and resisted plantarflexion. Pain does occasionally worsen to 10/10 pain, and during these attacks swelling and tenderness to palpation are also present. These attacks last roughly 24-48 hours before self-resolving. His most recent flare was 8/19 and he was seen in our Urgent Care at the time. At that visit, imaging of the ankle was negative and uric acid levels were within normal limits. He was prescribed a prednisone taper; however, his symptoms improved over the following 24 hours and he did not take the medication. Was also given 12 Vicodin for severe pain, which did provide relief. Previous gout flares have occurred in the left knee, and he is currently on allopurinol 300 mg PO daily.  Past medical history, Surgical history, Family history not pertinant except as noted below, Social history, Allergies, and medications have been entered into the medical record, reviewed, and no changes needed.   Review of Systems: No fevers, chills, night sweats, or weight loss.   Objective:    General: Well developed, well nourished, and in no acute distress.  Neuro: Alert and oriented x3, extra-ocular muscles intact, sensation grossly intact.  HEENT: Normocephalic, atraumatic, pupils equal round reactive to light, neck supple, no masses, no lymphadenopathy, thyroid nonpalpable.  Skin: Warm and dry, no rashes. Cardiac: Regular rate and rhythm, no murmurs rubs or gallops, no lower extremity edema.  Respiratory: Clear to auscultation bilaterally. Not using accessory muscles, speaking in full sentences.  Right Ankle: No visible  erythema or swelling. Range of motion is full in all directions. Strength is 5/5 in all directions. Stable lateral and medial ligaments; squeeze test and kleiger test unremarkable; Talar dome tender to palpation; No pain at base of 5th MT; No tenderness over cuboid; No tenderness over N spot or navicular prominence No tenderness on posterior aspects of lateral and medial malleolus No sign of peroneal tendon subluxations or tenderness to palpation Negative tarsal tunnel tinel's Able to walk 4 steps.  Impression and Recommendations:   Left Ankle Pain: While gout or pseudogout is the most likely diagnosis in this patient with a history of gout who now reports cycles of severe pain, swelling, and disability that resolve after 24-48 hours, we have not yet done an arthrocentesis of the ankle and are thus unable to make the diagnosis. As there is no swelling or effusion at the present time, we are unable to aspirate the joint. Also on the differential diagnosis is osteoarthritis. Osteomyelitis is unlikely in this patient without systemic symptoms or joint erythema. - Indomethacin for 2 weeks, with instructions to return for joint injection if pain persists - Instructed to return immediately if he has a flare with swelling for aspiration with crystal analysis.  Hypertension: Blood pressure is elevated to 164/101 during today's visit; however, he does report receiving a speeding ticket recently which has caused an increase in stress. We will continue the current regimen, with instructions to return in 2 weeks for recheck.

## 2014-03-12 NOTE — Assessment & Plan Note (Signed)
Elevated, but he recently got a speeding ticket. Return in 2 weeks to recheck.

## 2014-03-12 NOTE — Assessment & Plan Note (Addendum)
Likely had a recurrent flare in the right ankle however there is no swelling or effusion, spontaneously improved. Uric acid levels were low. We do not yet have a solid diagnosis of gout, we have not done an arthrocentesis. He will take indomethacin, and if still having pain in 2 weeks we can inject the joint, if he does have a flare with swelling he should come to see me immediately so that we can do an aspiration with crystal analysis. It's on the weekend I am happy to come into the urgent care Center to do an aspiration.

## 2014-03-25 ENCOUNTER — Ambulatory Visit: Payer: BC Managed Care – PPO | Admitting: Sports Medicine

## 2014-03-25 ENCOUNTER — Ambulatory Visit (INDEPENDENT_AMBULATORY_CARE_PROVIDER_SITE_OTHER): Payer: BC Managed Care – PPO | Admitting: Sports Medicine

## 2014-03-25 VITALS — BP 131/77 | HR 65 | Wt 217.0 lb

## 2014-03-25 DIAGNOSIS — E291 Testicular hypofunction: Secondary | ICD-10-CM

## 2014-03-25 MED ORDER — TESTOSTERONE CYPIONATE 200 MG/ML IM SOLN
200.0000 mg | Freq: Once | INTRAMUSCULAR | Status: AC
  Administered 2014-03-25: 200 mg via INTRAMUSCULAR

## 2014-03-25 NOTE — Progress Notes (Signed)
   Subjective:    Patient ID: Eduardo Mclean, male    DOB: [birth year], 53 y.o.   MRN: [id number]  HPI  Eduardo Mclean is here for a testosterone injection. Denies chest pain, shortness of breath, headaches or mood changes.    Review of Systems     Objective:   Physical Exam        Assessment & Plan:  Patient tolerated injection well without any complications. Patient advised to schedule his next injection for 2 weeks from today.

## 2014-03-25 NOTE — Assessment & Plan Note (Signed)
Testosterone injection as above. 

## 2014-04-08 ENCOUNTER — Ambulatory Visit (INDEPENDENT_AMBULATORY_CARE_PROVIDER_SITE_OTHER): Payer: BC Managed Care – PPO | Admitting: Sports Medicine

## 2014-04-08 VITALS — BP 121/78 | HR 74 | Resp 16 | Wt 221.0 lb

## 2014-04-08 DIAGNOSIS — E291 Testicular hypofunction: Secondary | ICD-10-CM | POA: Diagnosis not present

## 2014-04-08 MED ORDER — TESTOSTERONE CYPIONATE 200 MG/ML IM SOLN
200.0000 mg | Freq: Once | INTRAMUSCULAR | Status: AC
Start: 2014-04-08 — End: 2014-04-08
  Administered 2014-04-08: 200 mg via INTRAMUSCULAR

## 2014-04-08 MED ORDER — TESTOSTERONE CYPIONATE 200 MG/ML IM SOLN: 200.0000 mg | INTRAMUSCULAR | Status: DC

## 2014-04-08 NOTE — Progress Notes (Signed)
   Subjective:    Patient ID: Eduardo Mclean, male    DOB: [birth year], 53 y.o.   MRN: [id number]  HPIPatient is here for testosterone injection. Denies chest pain, headaches, shortness of breath and mood changes.    Review of Systems     Objective:   Physical Exam        Assessment & Plan:  Patient tolerated injection well without complications. Patient advised to schedule his next injection for 2 weeks from today.

## 2014-04-09 NOTE — Assessment & Plan Note (Signed)
Testosterone injection as above. 

## 2014-04-19 ENCOUNTER — Emergency Department
Admission: EM | Admit: 2014-04-19 | Discharge: 2014-04-19 | Disposition: A | Discharge status: Home / Self Care | Payer: BC Managed Care – PPO | Source: Home / Self Care | Attending: Emergency Medicine | Admitting: Emergency Medicine

## 2014-04-19 ENCOUNTER — Emergency Department (INDEPENDENT_AMBULATORY_CARE_PROVIDER_SITE_OTHER): Payer: BC Managed Care – PPO

## 2014-04-19 ENCOUNTER — Encounter: Payer: Self-pay | Admitting: Emergency Medicine

## 2014-04-19 DIAGNOSIS — S62101A Fracture of unspecified carpal bone, right wrist, initial encounter for closed fracture: Secondary | ICD-10-CM

## 2014-04-19 DIAGNOSIS — M25531 Pain in right wrist: Secondary | ICD-10-CM

## 2014-04-19 DIAGNOSIS — S93401A Sprain of unspecified ligament of right ankle, initial encounter: Secondary | ICD-10-CM

## 2014-04-19 DIAGNOSIS — M7731 Calcaneal spur, right foot: Secondary | ICD-10-CM

## 2014-04-19 MED ORDER — MELOXICAM 15 MG PO TABS: 15.0000 mg | ORAL_TABLET | Freq: Every day | ORAL | Status: DC

## 2014-04-19 MED ORDER — HYDROCODONE-ACETAMINOPHEN 5-325 MG PO TABS: 1.0000 | ORAL_TABLET | ORAL | Status: DC | PRN

## 2014-04-19 NOTE — ED Notes (Signed)
Pt reports falling from his privacy fence yesterday after trying to climb over. He rolled right ankle and tried to brace self with right wrist. No previous injuries.

## 2014-04-19 NOTE — ED Provider Notes (Signed)
CSN: [id number]     Arrival date & time 04/19/14  1210 History   First MD Initiated Contact with Patient 04/19/14 1233     Chief Complaint  Patient presents with  . Ankle Pain  . Wrist Pain    HPI Pt reports falling from his home privacy fence yesterday after trying to climb over. He rolled right ankle and tried to brace self with right wrist. No previous injuries. Both the right ankle and right wrist pain is sharp, moderately severe, 10 out of 10 right wrist with movement and 8/10 right ankle with movement. He is able to weight-bear with pain. No definite numbness or focal weakness. No neck or back pain. Denies chest pain or shortness of breath or nausea or vomiting. No headache. No focal neurologic symptoms. No loss of consciousness.  Past Medical History  Diagnosis Date  . GERD (gastroesophageal reflux disease)   . Gout   . Hypertension   . Seasonal allergies    Past Surgical History  Procedure Laterality Date  . Back surgery     Family History  Problem Relation Age of Onset  . Hypertension Father    History  Substance Use Topics  . Smoking status: Current Some Day Smoker  . Smokeless tobacco: Never Used  . Alcohol Use: Yes    Review of Systems  All other systems reviewed and are negative.   Allergies  Review of patient's allergies indicates no known allergies.  Home Medications   Prior to Admission medications   Medication Sig Start Date End Date Taking? Authorizing Provider  allopurinol (ZYLOPRIM) 300 MG tablet Take 1 tablet (300 mg total) by mouth daily. 10/24/13   Monica Becton, MD  amLODipine (NORVASC) 10 MG tablet Take 0.5 tablets (5 mg total) by mouth daily. 06/19/13   Monica Becton, MD  fexofenadine (ALLEGRA) 180 MG tablet Take 180 mg by mouth daily.    Historical Provider, MD  fluticasone (FLONASE) 50 MCG/ACT nasal spray Place 2 sprays into the nose daily.    Historical Provider, MD  HYDROcodone-acetaminophen (NORCO/VICODIN) 5-325 MG per  tablet Take 1-2 tablets by mouth every 4 (four) hours as needed for severe pain. Take with food. 04/19/14   Lajean Manes, MD  hydrocortisone (ANUSOL-HC) 25 MG suppository Place 1 suppository (25 mg total) rectally 2 (two) times daily. 09/07/13   Lattie Haw, MD  indomethacin (INDOCIN) 50 MG capsule Take 50 mg by mouth 2 (two) times daily with a meal.    Historical Provider, MD  losartan (COZAAR) 100 MG tablet TAKE ONE TABLET BY MOUTH ONE TIME DAILY     Monica Becton, MD  meloxicam (MOBIC) 15 MG tablet One tab PO qAM with breakfast for 2 weeks, then daily prn pain. 07/16/13   Monica Becton, MD  meloxicam (MOBIC) 15 MG tablet Take 1 tablet (15 mg total) by mouth daily. As needed for pain and inflammation. Take with food. 04/19/14   Lajean Manes, MD  metoprolol tartrate (LOPRESSOR) 25 MG tablet Take 1 tablet (25 mg total) by mouth 2 (two) times daily. 10/22/13   Monica Becton, MD  omeprazole (PRILOSEC) 20 MG capsule TAKE ONE CAPSULE BY MOUTH ONE TIME DAILY 02/12/14   Monica Becton, MD  testosterone cypionate (DEPOTESTOTERONE CYPIONATE) 200 MG/ML injection Inject 1 mL (200 mg total) into the muscle every 14 (fourteen) days. 08/14/13   Monica Becton, MD  vardenafil (LEVITRA) 20 MG tablet Take 1 tablet (20 mg total) by mouth as needed for  erectile dysfunction. 07/16/13 07/16/14  Monica Bectonhomas J Thekkekandam, MD   BP 150/110  Pulse 69  Resp 14  SpO2 97% Physical Exam  Nursing note and vitals reviewed. Constitutional: He is oriented to person, place, and time. He appears well-developed and well-nourished. No distress.   Uncomfortable from right wrist and right ankle pain  HENT:  Head: Normocephalic and atraumatic.  Eyes: Conjunctivae and EOM are normal. Pupils are equal, round, and reactive to light. No scleral icterus.  Neck: Normal range of motion.  Cardiovascular: Normal rate.   Pulmonary/Chest: Effort normal.  Abdominal: He exhibits no distension.  Musculoskeletal:        Right wrist: He exhibits decreased range of motion, tenderness, bony tenderness and swelling (Mild). He exhibits no deformity and no laceration.       Right ankle: He exhibits decreased range of motion and swelling (Minimal medial and lateral). He exhibits no ecchymosis, no deformity, no laceration and normal pulse. Tenderness. Lateral malleolus, medial malleolus and AITFL tenderness found. No head of 5th metatarsal tenderness found. Achilles tendon normal.       Right hand: Normal. He exhibits normal capillary refill and no swelling. Normal sensation noted. Normal strength noted.       Right foot: Normal. He exhibits normal range of motion, no tenderness, no bony tenderness and normal capillary refill.  Neurological: He is alert and oriented to person, place, and time.  Skin: Skin is warm.  Psychiatric: He has a normal mood and affect.   Wt Readings from Last 3 Encounters:  04/08/14 221 lb (100.245 kg)  03/25/14 217 lb (98.431 kg)  03/12/14 217 lb (98.431 kg)   Temp Readings from Last 3 Encounters:  02/20/14 98 F (36.7 C) Oral  01/28/14 98.1 F (36.7 C) Oral  01/14/14 98.2 F (36.8 C) Oral   BP Readings from Last 3 Encounters:  04/19/14 150/110  04/08/14 121/78  03/25/14 131/77   Pulse Readings from Last 3 Encounters:  04/19/14 69  04/08/14 74  03/25/14 65   Today's BP elevation is likely secondary to acute pain, but advised him to have BP rechecked with PCP within the next week.  ED Course  Procedures (including critical care time) Labs Review Labs Reviewed - No data to display  Imaging Review Dg Wrist Complete Right  04/19/2014   CLINICAL DATA:  Wrist pain.  Fall last p.m.  Initial evaluation .  EXAM: RIGHT WRIST - COMPLETE 3+ VIEW  COMPARISON:  None.  FINDINGS: Mild soft tissue swelling noted about the wrist. Mild degenerative change. Subtle fracture chip from the radial styloid cannot be excluded. No other focal abnormality to  IMPRESSION: Subtle nondisplaced  fracture chip of the radial styloid cannot be excluded. No other acute bony abnormality identified.   Electronically Signed   By: Maisie Fushomas  Register   On: 04/19/2014 13:26   Dg Ankle Complete Right  04/19/2014   CLINICAL DATA:  Ankle pain  EXAM: RIGHT ANKLE - COMPLETE 3+ VIEW  COMPARISON:  02/20/2014  FINDINGS: Negative for fracture. Normal alignment. No joint effusion. Calcaneal spurring at the Achilles tendon insertion.  IMPRESSION: Negative.   Electronically Signed   By: Marlan Palauharles  Clark M.D.   On: 04/19/2014 13:22     MDM   1. Fracture of right wrist, closed, initial encounter   2. Sprain of right ankle, initial encounter    I personally reviewed x-rays, correlated that he is exquisitely tender to palpation on physical exam over the distal radius, consistent with a subtle nondisplaced fracture  chip of the radial styloid. Reviewed and discussed with Dr. Benjamin Stainhekkekandam.  Treatment options discussed, as well as risks, benefits, alternatives. Patient voiced understanding and agreement with the following plans: Encourage rest, ice, compression, and elevation of injured body part. Spica splint right wrist. Right ankle brace. Vicodin when necessary severe pain. Mobic 15 mg daily when necessary mild to moderate pain. Advised to call today and Make followup appointment with Dr. Benjamin Stainhekkekandam in 3 days  Precautions discussed. Red flags discussed. Questions invited and answered. Patient voiced understanding and agreement.     Lajean Manesavid Massey, MD 04/19/14 1356

## 2014-04-22 ENCOUNTER — Ambulatory Visit: Payer: BC Managed Care – PPO

## 2014-04-22 ENCOUNTER — Encounter: Payer: Self-pay | Admitting: Sports Medicine

## 2014-04-22 ENCOUNTER — Ambulatory Visit (INDEPENDENT_AMBULATORY_CARE_PROVIDER_SITE_OTHER): Payer: BC Managed Care – PPO | Admitting: Sports Medicine

## 2014-04-22 VITALS — BP 175/101 | HR 60 | Ht 67.0 in | Wt 223.0 lb

## 2014-04-22 DIAGNOSIS — E291 Testicular hypofunction: Secondary | ICD-10-CM | POA: Diagnosis not present

## 2014-04-22 DIAGNOSIS — S93421A Sprain of deltoid ligament of right ankle, initial encounter: Secondary | ICD-10-CM | POA: Diagnosis not present

## 2014-04-22 DIAGNOSIS — S52513A Displaced fracture of unspecified radial styloid process, initial encounter for closed fracture: Secondary | ICD-10-CM | POA: Insufficient documentation

## 2014-04-22 DIAGNOSIS — S52511A Displaced fracture of right radial styloid process, initial encounter for closed fracture: Secondary | ICD-10-CM

## 2014-04-22 MED ORDER — TESTOSTERONE CYPIONATE 200 MG/ML IM SOLN
200.0000 mg | INTRAMUSCULAR | Status: DC
  Administered 2014-04-22: 200 mg via INTRAMUSCULAR

## 2014-04-22 MED ORDER — HYDROCODONE-ACETAMINOPHEN 5-325 MG PO TABS: 1.0000 | ORAL_TABLET | Freq: Three times a day (TID) | ORAL | Status: DC | PRN

## 2014-04-22 NOTE — Progress Notes (Signed)
  Subjective:    CC: Urgent care followup  HPI: Right wrist: After a fall from a fence, pain over the radial styloid, mild, doing well in thumb spica brace. Moderate, persistent. No radiation.  Right ankle sprain: Medial pain, mild, able to bear weight.  Past medical history, Surgical history, Family history not pertinant except as noted below, Social history, Allergies, and medications have been entered into the medical record, reviewed, and no changes needed.   Review of Systems: No fevers, chills, night sweats, weight loss, chest pain, or shortness of breath.   Objective:    General: Well Developed, well nourished, and in no acute distress.  Neuro: Alert and oriented x3, extra-ocular muscles intact, sensation grossly intact.  HEENT: Normocephalic, atraumatic, pupils equal round reactive to light, neck supple, no masses, no lymphadenopathy, thyroid nonpalpable.  Skin: Warm and dry, no rashes. Cardiac: Regular rate and rhythm, no murmurs rubs or gallops, no lower extremity edema.  Respiratory: Clear to auscultation bilaterally. Not using accessory muscles, speaking in full sentences. Right Wrist: Inspection normal with no visible erythema or swelling. ROM smooth and normal with good flexion and extension and ulnar/radial deviation that is symmetrical with opposite wrist. Palpation is normal over metacarpals, navicular, lunate, and TFCC; tendons without tenderness/ swelling, tender palpation of the radial styloid process No snuffbox tenderness. No tenderness over Canal of Guyon. Strength 5/5 in all directions without pain. Negative Finkelstein, tinel's and phalens. Negative Watson's test. Right Ankle: No visible erythema or swelling. Range of motion is full in all directions. Strength is 5/5 in all directions. Tender palpation over the deltoid ligament. Stable lateral and medial ligaments; squeeze test and kleiger test unremarkable; Talar dome nontender; No pain at base of 5th  MT; No tenderness over cuboid; No tenderness over N spot or navicular prominence No tenderness on posterior aspects of lateral and medial malleolus No sign of peroneal tendon subluxations; Negative tarsal tunnel tinel's Able to walk 4 steps.  Impression and Recommendations:

## 2014-04-22 NOTE — Assessment & Plan Note (Signed)
Deltoid ligament sprain. Ankle is stable to valgus stress. Continue ASO. We will likely discontinue this at the two-week followup.

## 2014-04-22 NOTE — Assessment & Plan Note (Signed)
Occurred after a fall 3 days ago. Continue thumb spica brace. Hydrocodone for pain. Return in 2 weeks, no x-ray needed.  I billed a fracture code for this encounter, all subsequent visits will be post-op checks in the global period.

## 2014-05-06 ENCOUNTER — Ambulatory Visit (INDEPENDENT_AMBULATORY_CARE_PROVIDER_SITE_OTHER): Payer: BC Managed Care – PPO | Admitting: Sports Medicine

## 2014-05-06 ENCOUNTER — Encounter: Payer: Self-pay | Admitting: Sports Medicine

## 2014-05-06 VITALS — BP 129/80 | HR 71 | Ht 67.0 in | Wt 222.0 lb

## 2014-05-06 DIAGNOSIS — S52511A Displaced fracture of right radial styloid process, initial encounter for closed fracture: Secondary | ICD-10-CM

## 2014-05-06 DIAGNOSIS — I10 Essential (primary) hypertension: Secondary | ICD-10-CM | POA: Diagnosis not present

## 2014-05-06 DIAGNOSIS — S93421A Sprain of deltoid ligament of right ankle, initial encounter: Secondary | ICD-10-CM

## 2014-05-06 DIAGNOSIS — E291 Testicular hypofunction: Secondary | ICD-10-CM | POA: Diagnosis not present

## 2014-05-06 MED ORDER — TESTOSTERONE CYPIONATE 200 MG/ML IM SOLN
200.0000 mg | INTRAMUSCULAR | Status: DC
  Administered 2014-05-06: 200 mg via INTRAMUSCULAR

## 2014-05-06 MED ORDER — HYDROCODONE-ACETAMINOPHEN 5-325 MG PO TABS: 1.0000 | ORAL_TABLET | Freq: Three times a day (TID) | ORAL | Status: DC | PRN

## 2014-05-06 NOTE — Assessment & Plan Note (Signed)
Improving significantly 3 weeks out. Continue thumb spica. Refilling pain medication.  Return in 3 weeks

## 2014-05-06 NOTE — Progress Notes (Signed)
  Subjective: 3 weeks post fracture of the right radial styloid process and deltoid ligament sprain in the right ankle, doing well.   Objective: General: Well-developed, well-nourished, and in no acute distress. Right wrist: Still tender to palpation over the radial styloid tip. Right Ankle: No visible erythema or swelling. Range of motion is full in all directions. Strength is 5/5 in all directions. Stable lateral and medial ligaments; squeeze test and kleiger test unremarkable; Talar dome nontender; No pain at base of 5th MT; No tenderness over cuboid; No tenderness over N spot or navicular prominence No tenderness on posterior aspects of lateral and medial malleolus No sign of peroneal tendon subluxations; Negative tarsal tunnel tinel's Able to walk 4 steps.  Assessment/plan:

## 2014-05-06 NOTE — Assessment & Plan Note (Signed)
Clinically resolved.  

## 2014-05-06 NOTE — Assessment & Plan Note (Signed)
Initially elevated but well-controlled on recheck.

## 2014-05-20 ENCOUNTER — Ambulatory Visit (INDEPENDENT_AMBULATORY_CARE_PROVIDER_SITE_OTHER): Payer: BC Managed Care – PPO | Admitting: Sports Medicine

## 2014-05-20 VITALS — BP 146/91 | HR 74 | Wt 218.0 lb

## 2014-05-20 DIAGNOSIS — E291 Testicular hypofunction: Secondary | ICD-10-CM

## 2014-05-20 MED ORDER — TESTOSTERONE CYPIONATE 200 MG/ML IM SOLN
200.0000 mg | Freq: Once | INTRAMUSCULAR | Status: AC
  Administered 2014-05-20: 200 mg via INTRAMUSCULAR

## 2014-05-20 NOTE — Assessment & Plan Note (Signed)
Testosterone injection as above. 

## 2014-05-20 NOTE — Progress Notes (Signed)
   Subjective:    Patient ID: Eduardo Mclean, male    DOB: 1961/04/14, 53 y.o.   MRN: [id number]  HPI  Eduardo Mclean is here for a testosterone injection. Denies chest pain, shortness of breath, headaches or mood changes.   Review of Systems     Objective:   Physical Exam        Assessment & Plan:  Patient tolerated injection well without complications. Patient advised to schedule next injection 14 days from today.

## 2014-05-27 ENCOUNTER — Encounter: Payer: Self-pay | Admitting: Sports Medicine

## 2014-05-27 ENCOUNTER — Ambulatory Visit (INDEPENDENT_AMBULATORY_CARE_PROVIDER_SITE_OTHER): Payer: BC Managed Care – PPO | Admitting: Sports Medicine

## 2014-05-27 VITALS — BP 140/85 | HR 72 | Wt 222.0 lb

## 2014-05-27 DIAGNOSIS — S52511A Displaced fracture of right radial styloid process, initial encounter for closed fracture: Secondary | ICD-10-CM

## 2014-05-27 DIAGNOSIS — I10 Essential (primary) hypertension: Secondary | ICD-10-CM | POA: Diagnosis not present

## 2014-05-27 MED ORDER — METOPROLOL TARTRATE 50 MG PO TABS: 50.0000 mg | ORAL_TABLET | Freq: Two times a day (BID) | ORAL | Status: DC

## 2014-05-27 NOTE — Assessment & Plan Note (Signed)
Improved significantly. Okay to discontinue wrist brace.

## 2014-05-27 NOTE — Progress Notes (Signed)
  Subjective:    CC: Follow-up  HPI: Hypertension: Still slightly elevated. No headaches, visual changes, chest pain.  Right radial styloid fracture: Essentially pain-free now.  Past medical history, Surgical history, Family history not pertinant except as noted below, Social history, Allergies, and medications have been entered into the medical record, reviewed, and no changes needed.   Review of Systems: No fevers, chills, night sweats, weight loss, chest pain, or shortness of breath.   Objective:    General: Well Developed, well nourished, and in no acute distress.  Neuro: Alert and oriented x3, extra-ocular muscles intact, sensation grossly intact.  HEENT: Normocephalic, atraumatic, pupils equal round reactive to light, neck supple, no masses, no lymphadenopathy, thyroid nonpalpable.  Skin: Warm and dry, no rashes. Cardiac: Regular rate and rhythm, no murmurs rubs or gallops, no lower extremity edema.  Respiratory: Clear to auscultation bilaterally. Not using accessory muscles, speaking in full sentences. Right Wrist: Inspection normal with no visible erythema or swelling. ROM smooth and normal with good flexion and extension and ulnar/radial deviation that is symmetrical with opposite wrist. Palpation is normal over metacarpals, navicular, lunate, and TFCC; tendons without tenderness/ swelling No snuffbox tenderness. No tenderness over Canal of Guyon. Strength 5/5 in all directions without pain. Negative Finkelstein, tinel's and phalens. Negative Watson's test.  Impression and Recommendations:

## 2014-05-27 NOTE — Addendum Note (Signed)
Addended by: Monica BectonHEKKEKANDAM, Kristianna Saperstein J on: 05/27/2014 05:51 PM   Modules accepted: Level of Service

## 2014-05-27 NOTE — Assessment & Plan Note (Signed)
Still somewhat elevated. Continue amlodipine 10, Cozaar 100, and increasing metoprolol to 50 mg daily.

## 2014-06-03 ENCOUNTER — Ambulatory Visit (INDEPENDENT_AMBULATORY_CARE_PROVIDER_SITE_OTHER): Payer: BC Managed Care – PPO | Admitting: Sports Medicine

## 2014-06-03 VITALS — BP 147/92 | HR 84 | Wt 215.0 lb

## 2014-06-03 DIAGNOSIS — E291 Testicular hypofunction: Secondary | ICD-10-CM | POA: Diagnosis not present

## 2014-06-03 MED ORDER — TESTOSTERONE CYPIONATE 100 MG/ML IM SOLN
200.0000 mg | Freq: Once | INTRAMUSCULAR | Status: AC
  Administered 2014-06-03: 200 mg via INTRAMUSCULAR

## 2014-06-03 NOTE — Assessment & Plan Note (Signed)
Testosterone injection as above. 

## 2014-06-03 NOTE — Progress Notes (Signed)
Testosterone given per nurse.

## 2014-06-17 ENCOUNTER — Ambulatory Visit (INDEPENDENT_AMBULATORY_CARE_PROVIDER_SITE_OTHER): Payer: BC Managed Care – PPO | Admitting: Sports Medicine

## 2014-06-17 VITALS — BP 134/79 | HR 67 | Wt 219.0 lb

## 2014-06-17 DIAGNOSIS — E291 Testicular hypofunction: Secondary | ICD-10-CM

## 2014-06-17 MED ORDER — TESTOSTERONE CYPIONATE 200 MG/ML IM SOLN
200.0000 mg | Freq: Once | INTRAMUSCULAR | Status: AC
  Administered 2014-06-17: 200 mg via INTRAMUSCULAR

## 2014-06-17 NOTE — Assessment & Plan Note (Signed)
Testosterone injection as above. 

## 2014-06-17 NOTE — Progress Notes (Signed)
   Subjective:    Patient ID: Eduardo Mclean, male    DOB: [birth year], 53 y.o.   MRN: [id number]  HPI  Eduardo Mclean is here for a testosterone injection. Denies chest pain, shortness of breath, headaches or mood changes. Patient is wanting to know if there was anyway to change his schedule to once a month.    Review of Systems     Objective:   Physical Exam        Assessment & Plan:  Patient tolerated injection well without complications. Patient advised to schedule next injection 14 days from today.

## 2014-06-18 ENCOUNTER — Encounter: Payer: BC Managed Care – PPO | Admitting: Sports Medicine

## 2014-06-19 NOTE — Progress Notes (Signed)
This encounter was created in error - please disregard.

## 2014-06-24 ENCOUNTER — Telehealth: Payer: Self-pay | Admitting: Sports Medicine

## 2014-06-24 DIAGNOSIS — E291 Testicular hypofunction: Secondary | ICD-10-CM

## 2014-06-24 NOTE — Telephone Encounter (Signed)
I spoke with patient. He did not believe he had an appointment the next day. If I had of known I would have cancelled the appointment.   I will call his insurance company to see if they will cover Aveed. The 3 month testosterone injection.

## 2014-06-24 NOTE — Telephone Encounter (Signed)
The current testosterone is not a good option for once a month injections, Aveed is an option, so I will wait to hear back regarding whether this is covered her not. I will send a message to Molli HazardMatthew to remove the no-show fee, has he said anything about doing the injections himself at home every 2 weeks so that he doesn't have to come here?

## 2014-06-24 NOTE — Telephone Encounter (Signed)
Patient called to see if there was an answer about him coming for his T shots once a month instead of every two weeks. He said that he [facility] it to [facility] and was waiting on callback about this. (Not sure if you have any information about this to pass onto patient).  Also, he said that you (Dr T) told him to just have Marylene LandAngela check his BP when he was here for his shot and that he was told that he didn't need to come in on the 15th (since he was here on the 14th for nurse visit).  It is showing as a no show fee for the 15th.  Can you please clarify and have Molli HazardMatthew remove the NO SHOW if he was in fact told that he did not need to come back the next day?  thanks

## 2014-06-25 NOTE — Telephone Encounter (Signed)
Lets look into Eduardo Mclean, in the meantime he can come in monthly, we do need to check his testosterone levels 2 weeks after his injection and adjust the dose accordingly.

## 2014-06-25 NOTE — Telephone Encounter (Signed)
Eduardo Mclean reported to me he does not want to give himself injections. I told him he could come in Saturday to the Urgent Care and I will give him his next testosterone injection. The problem is more about not always having time during the week to come in for a nurse visit.

## 2014-07-01 ENCOUNTER — Ambulatory Visit: Payer: BC Managed Care – PPO

## 2014-07-01 ENCOUNTER — Ambulatory Visit (INDEPENDENT_AMBULATORY_CARE_PROVIDER_SITE_OTHER): Payer: BC Managed Care – PPO | Admitting: Sports Medicine

## 2014-07-01 VITALS — BP 141/93 | HR 74 | Wt 223.0 lb

## 2014-07-01 DIAGNOSIS — E291 Testicular hypofunction: Secondary | ICD-10-CM

## 2014-07-01 MED ORDER — TESTOSTERONE CYPIONATE 200 MG/ML IM SOLN
200.0000 mg | Freq: Once | INTRAMUSCULAR | Status: AC
  Administered 2014-07-01: 200 mg via INTRAMUSCULAR

## 2014-07-01 NOTE — Assessment & Plan Note (Signed)
Testosterone injection as above. 

## 2014-07-01 NOTE — Progress Notes (Signed)
   Subjective:    Patient ID: Eduardo Mclean, male    DOB: 07/02/1961, 53 y.o.   MRN: [id number]  HPI  Paco Lowenthal is here for a testosterone injection. Denies chest pain, shortness of breath, headaches or mood changes.   Review of Systems     Objective:   Physical Exam        Assessment & Plan:  Patient tolerated injection well without complications. Patient advised to schedule next injection 14 days from today.  

## 2014-07-08 ENCOUNTER — Emergency Department (INDEPENDENT_AMBULATORY_CARE_PROVIDER_SITE_OTHER)
Admission: EM | Admit: 2014-07-08 | Discharge: 2014-07-08 | Disposition: A | Discharge status: Home / Self Care | Payer: BLUE CROSS/BLUE SHIELD | Source: Home / Self Care | Attending: Emergency Medicine | Admitting: Emergency Medicine

## 2014-07-08 ENCOUNTER — Encounter: Payer: Self-pay | Admitting: Emergency Medicine

## 2014-07-08 DIAGNOSIS — L02811 Cutaneous abscess of head [any part, except face]: Secondary | ICD-10-CM

## 2014-07-08 DIAGNOSIS — K088 Other specified disorders of teeth and supporting structures: Secondary | ICD-10-CM

## 2014-07-08 DIAGNOSIS — K0889 Other specified disorders of teeth and supporting structures: Secondary | ICD-10-CM

## 2014-07-08 MED ORDER — CEPHALEXIN 500 MG PO CAPS: 500.0000 mg | ORAL_CAPSULE | Freq: Four times a day (QID) | ORAL | Status: DC

## 2014-07-08 MED ORDER — HYDROCODONE-ACETAMINOPHEN 5-325 MG PO TABS: 1.0000 | ORAL_TABLET | ORAL | Status: DC | PRN

## 2014-07-08 NOTE — ED Notes (Signed)
Patient presents for 2 reasons; pain in left tmj area; possible ingrown hair that has become infected on back of occipital/head.

## 2014-07-08 NOTE — Discharge Instructions (Signed)
Abscess An abscess is an infected area that contains a collection of pus and debris.It can occur in almost any part of the body. An abscess is also known as a furuncle or boil. CAUSES  An abscess occurs when tissue gets infected. This can occur from blockage of oil or sweat glands, infection of hair follicles, or a minor injury to the skin. As the body tries to fight the infection, pus collects in the area and creates pressure under the skin. This pressure causes pain. People with weakened immune systems have difficulty fighting infections and get certain abscesses more often.  SYMPTOMS Usually an abscess develops on the skin and becomes a painful mass that is red, warm, and tender. If the abscess forms under the skin, you may feel a moveable soft area under the skin. Some abscesses break open (rupture) on their own, but most will continue to get worse without care. The infection can spread deeper into the body and eventually into the bloodstream, causing you to feel ill.  DIAGNOSIS  Your caregiver will take your medical history and perform a physical exam. A sample of fluid may also be taken from the abscess to determine what is causing your infection. TREATMENT  Your caregiver may prescribe antibiotic medicines to fight the infection. However, taking antibiotics alone usually does not cure an abscess. Your caregiver may need to make a small cut (incision) in the abscess to drain the pus. In some cases, gauze is packed into the abscess to reduce pain and to continue draining the area. HOME CARE INSTRUCTIONS   Only take over-the-counter or prescription medicines for pain, discomfort, or fever as directed by your caregiver.  If you were prescribed antibiotics, take them as directed. Finish them even if you start to feel better.  If gauze is used, follow your caregiver's directions for changing the gauze.  To avoid spreading the infection:  Keep your draining abscess covered with a  bandage.  Wash your hands well.  Do not share personal care items, towels, or whirlpools with others.  Avoid skin contact with others.  Keep your skin and clothes clean around the abscess.  Keep all follow-up appointments as directed by your caregiver. SEEK MEDICAL CARE IF:   You have increased pain, swelling, redness, fluid drainage, or bleeding.  You have muscle aches, chills, or a general ill feeling.  You have a fever. MAKE SURE YOU:   Understand these instructions.  Will watch your condition.  Will get help right away if you are not doing well or get worse. Document Released: 03/31/2005 Document Revised: 12/21/2011 Document Reviewed: 09/03/2011 [facility] Patient Information 2015 [place], [place]. This information is not intended to replace advice given to you by your health care provider. Make sure you discuss any questions you have with your health care provider. Dental Abscess A dental abscess is a collection of infected fluid (pus) from a bacterial infection in the inner part of the tooth (pulp). It usually occurs at the end of the tooth's root.  CAUSES   Severe tooth decay.  Trauma to the tooth that allows bacteria to enter into the pulp, such as a broken or chipped tooth. SYMPTOMS   Severe pain in and around the infected tooth.  Swelling and redness around the abscessed tooth or in the mouth or face.  Tenderness.  Pus drainage.  Bad breath.  Bitter taste in the mouth.  Difficulty swallowing.  Difficulty opening the mouth.  Nausea.  Vomiting.  Chills.  Swollen neck glands. DIAGNOSIS  A medical and dental history will be taken.  An examination will be performed by tapping on the abscessed tooth.  X-rays may be taken of the tooth to identify the abscess. TREATMENT The goal of treatment is to eliminate the infection. You may be prescribed antibiotic medicine to stop the infection from spreading. A root canal may be performed to save the tooth.  If the tooth cannot be saved, it may be pulled (extracted) and the abscess may be drained.  HOME CARE INSTRUCTIONS  Only take over-the-counter or prescription medicines for pain, fever, or discomfort as directed by your caregiver.  Rinse your mouth (gargle) often with salt water ( tsp salt in 8 oz [250 ml] of warm water) to relieve pain or swelling.  Do not drive after taking pain medicine (narcotics).  Do not apply heat to the outside of your face.  Return to your dentist for further treatment as directed. SEEK MEDICAL CARE IF:  Your pain is not helped by medicine.  Your pain is getting worse instead of better. SEEK IMMEDIATE MEDICAL CARE IF:  You have a fever or persistent symptoms for more than 2-3 days.  You have a fever and your symptoms suddenly get worse.  You have chills or a very bad headache.  You have problems breathing or swallowing.  You have trouble opening your mouth.  You have swelling in the neck or around the eye. Document Released: 06/21/2005 Document Revised: 03/15/2012 Document Reviewed: 09/29/2010 [facility] Patient Information 2015 [place], [place]. This information is not intended to replace advice given to you by your health care provider. Make sure you discuss any questions you have with your health care provider.

## 2014-07-09 NOTE — ED Provider Notes (Signed)
CSN: [id number]     Arrival date & time 07/08/14  1839 History   First MD Initiated Contact with Patient 07/08/14 1919     Chief Complaint  Patient presents with  . Temporomandibular Joint Pain  . Cyst   (Consider location/radiation/quality/duration/timing/severity/associated sxs/prior Treatment) Patient is a 54 y.o. male presenting with abscess. The history is provided by the patient. No language interpreter was used.  Abscess Location:  Head/neck Head/neck abscess location:  Scalp Size:  2 Abscess quality: induration   Abscess quality: not draining   Red streaking: no   Duration:  3 days Progression:  Worsening Chronicity:  New Context: not diabetes   Relieved by:  Nothing Worsened by:  Nothing tried Ineffective treatments:  None tried Associated symptoms: no nausea     Past Medical History  Diagnosis Date  . GERD (gastroesophageal reflux disease)   . Gout   . Hypertension   . Seasonal allergies    Past Surgical History  Procedure Laterality Date  . Back surgery     Family History  Problem Relation Age of Onset  . Hypertension Father    History  Substance Use Topics  . Smoking status: Current Some Day Smoker  . Smokeless tobacco: Never Used  . Alcohol Use: Yes    Review of Systems  Gastrointestinal: Negative for nausea.  Skin: Positive for rash.  All other systems reviewed and are negative. Pt also complains of pain in left jaw.   Hurts to open and close mouth  Allergies  Review of patient's allergies indicates no known allergies.  Home Medications   Prior to Admission medications   Medication Sig Start Date End Date Taking? Authorizing Provider  allopurinol (ZYLOPRIM) 300 MG tablet Take 1 tablet (300 mg total) by mouth daily. 10/24/13   Monica Bectonhomas J Thekkekandam, MD  amLODipine (NORVASC) 10 MG tablet Take 0.5 tablets (5 mg total) by mouth daily. 06/19/13   Monica Bectonhomas J Thekkekandam, MD  cephALEXin (KEFLEX) 500 MG capsule Take 1 capsule (500 mg total) by mouth  4 (four) times daily. 07/08/14   Elson AreasLeslie K Sofia, PA-C  fexofenadine (ALLEGRA) 180 MG tablet Take 180 mg by mouth daily.    Historical Provider, MD  fluticasone (FLONASE) 50 MCG/ACT nasal spray Place 2 sprays into the nose daily.    Historical Provider, MD  HYDROcodone-acetaminophen (NORCO/VICODIN) 5-325 MG per tablet Take 1 tablet by mouth every 8 (eight) hours as needed for moderate pain. 05/06/14   Monica Bectonhomas J Thekkekandam, MD  HYDROcodone-acetaminophen (NORCO/VICODIN) 5-325 MG per tablet Take 1-2 tablets by mouth every 4 (four) hours as needed. 07/08/14   Elson AreasLeslie K Sofia, PA-C  hydrocortisone (ANUSOL-HC) 25 MG suppository Place 1 suppository (25 mg total) rectally 2 (two) times daily. 09/07/13   Lattie HawStephen A Beese, MD  indomethacin (INDOCIN) 50 MG capsule Take 50 mg by mouth 2 (two) times daily with a meal.    Historical Provider, MD  losartan (COZAAR) 100 MG tablet TAKE ONE TABLET BY MOUTH ONE TIME DAILY     Monica Bectonhomas J Thekkekandam, MD  meloxicam (MOBIC) 15 MG tablet One tab PO qAM with breakfast for 2 weeks, then daily prn pain. 07/16/13   Monica Bectonhomas J Thekkekandam, MD  meloxicam (MOBIC) 15 MG tablet Take 1 tablet (15 mg total) by mouth daily. As needed for pain and inflammation. Take with food. 04/19/14   Lajean Manesavid Massey, MD  metoprolol tartrate (LOPRESSOR) 50 MG tablet Take 1 tablet (50 mg total) by mouth 2 (two) times daily. 05/27/14   Monica Bectonhomas J Thekkekandam,  MD  omeprazole (PRILOSEC) 20 MG capsule TAKE ONE CAPSULE BY MOUTH ONE TIME DAILY 02/12/14   Monica Becton, MD  testosterone cypionate (DEPOTESTOTERONE CYPIONATE) 200 MG/ML injection Inject 1 mL (200 mg total) into the muscle every 14 (fourteen) days. 08/14/13   Monica Becton, MD  vardenafil (LEVITRA) 20 MG tablet Take 1 tablet (20 mg total) by mouth as needed for erectile dysfunction. 07/16/13 07/16/14  Monica Becton, MD   BP 151/96 mmHg  Pulse 66  Temp(Src) 98.1 F (36.7 C) (Oral)  Resp 16  Ht  (1.702 m)  Wt 223 lb (101.152 kg)   BMI 34.92 kg/m2  SpO2 95% Physical Exam  Constitutional: He is oriented to person, place, and time. He appears well-developed and well-nourished.  HENT:  Head: Normocephalic.  Swollen left lower gum  One tooth,  Swelling around tooth  Eyes: EOM are normal. Pupils are equal, round, and reactive to light.  Neck: Normal range of motion.  Pulmonary/Chest: Effort normal.  Abdominal: He exhibits no distension.  Musculoskeletal: Normal range of motion.  Neurological: He is alert and oriented to person, place, and time.  Skin: There is erythema.  Swollen area back of scalp.     Psychiatric: He has a normal mood and affect.  Nursing note and vitals reviewed.   ED Course  INCISION AND DRAINAGE Date/Time: 07/09/2014 8:54 AM Performed by: Elson Areas Authorized by: Georgina Pillion, DAVID Consent: Verbal consent obtained. Risks and benefits: risks, benefits and alternatives were discussed Patient understanding: patient states understanding of the procedure being performed Patient identity confirmed: verbally with patient Time out: Immediately prior to procedure a "time out" was called to verify the correct patient, procedure, equipment, support staff and site/side marked as required. Type: abscess Body area: head/neck Location details: scalp Anesthesia: local infiltration Anesthetic total: 2 ml Patient sedated: no Scalpel size: 11 Incision type: single straight Complexity: simple Drainage: purulent Drainage amount: moderate Wound treatment: wound left open Patient tolerance: Patient tolerated the procedure well with no immediate complications   (including critical care time) Labs Review Labs Reviewed - No data to display  Imaging Review No results found.   MDM   1. Abscess, scalp   2. Pain, dental   Hydrocodone Keflex Soak scalp Schedule to se your dentist for evaluation. Return if any problems. AVS    Elson Areas, PA-C 07/09/14 [phone]

## 2014-07-18 ENCOUNTER — Other Ambulatory Visit: Payer: Self-pay | Admitting: Sports Medicine

## 2014-07-19 NOTE — Telephone Encounter (Signed)
I called Eduardo FearingJames and let him know about Aveed and he was in agreeance to medication and I told him that I had submitted the auth to insurance and I should know something by the middle of next week. He said that he would wait to hear back from out office.

## 2014-07-19 NOTE — Telephone Encounter (Signed)
Could you find out if his insurance company will cover Aveed injections in the office.

## 2014-07-29 ENCOUNTER — Telehealth: Payer: Self-pay

## 2014-07-29 NOTE — Telephone Encounter (Signed)
PA for Aveed denied. Paperwork in your basket; please advise.

## 2014-07-29 NOTE — Telephone Encounter (Signed)
PA for medication

## 2014-07-29 NOTE — Telephone Encounter (Signed)
Denied, he will have to continue to use the 2 week injection.

## 2014-08-02 ENCOUNTER — Other Ambulatory Visit: Payer: Self-pay | Admitting: Sports Medicine

## 2014-08-10 ENCOUNTER — Encounter: Payer: Self-pay | Admitting: *Deleted

## 2014-08-10 ENCOUNTER — Emergency Department
Admission: EM | Admit: 2014-08-10 | Discharge: 2014-08-10 | Disposition: A | Discharge status: Home / Self Care | Payer: BLUE CROSS/BLUE SHIELD | Source: Home / Self Care | Attending: Family Medicine | Admitting: Family Medicine

## 2014-08-10 DIAGNOSIS — L739 Follicular disorder, unspecified: Secondary | ICD-10-CM

## 2014-08-10 MED ORDER — SULFAMETHOXAZOLE-TRIMETHOPRIM 800-160 MG PO TABS: 2.0000 | ORAL_TABLET | Freq: Two times a day (BID) | ORAL | Status: DC

## 2014-08-10 MED ORDER — MUPIROCIN 2 % EX OINT: 1.0000 "application " | TOPICAL_OINTMENT | Freq: Two times a day (BID) | CUTANEOUS | Status: DC

## 2014-08-10 NOTE — ED Provider Notes (Signed)
Eduardo Mclean is a 54 y.o. male who presents to Urgent Care today for folliculitis. Patient has cellulitis of the posterior scalp. He was seen a few weeks ago for the same issue and given Keflex. His symptoms resolved almost completely but returned recently. He notes mild tenderness. No fevers or chills nausea vomiting or diarrhea.   Past Medical History  Diagnosis Date  . GERD (gastroesophageal reflux disease)   . Gout   . Hypertension   . Seasonal allergies    Past Surgical History  Procedure Laterality Date  . Back surgery     History  Substance Use Topics  . Smoking status: Current Some Day Smoker  . Smokeless tobacco: Never Used  . Alcohol Use: Yes   ROS as above Medications: No current facility-administered medications for this encounter.   Current Outpatient Prescriptions  Medication Sig Dispense Refill  . allopurinol (ZYLOPRIM) 300 MG tablet Take 1 tablet (300 mg total) by mouth daily. 30 tablet 11  . amLODipine (NORVASC) 10 MG tablet Take 0.5 tablets (5 mg total) by mouth daily. 30 tablet 11  . fexofenadine (ALLEGRA) 180 MG tablet Take 180 mg by mouth daily.    . fluticasone (FLONASE) 50 MCG/ACT nasal spray Place 2 sprays into the nose daily.    . hydrocortisone (ANUSOL-HC) 25 MG suppository Place 1 suppository (25 mg total) rectally 2 (two) times daily. 12 suppository 1  . LEVITRA 20 MG tablet take one tablet by mouth as needed for erectile dysfunction 3 tablet 10  . losartan (COZAAR) 100 MG tablet TAKE ONE TABLET BY MOUTH ONE TIME DAILY  90 tablet 3  . meloxicam (MOBIC) 15 MG tablet One tab PO qAM with breakfast for 2 weeks, then daily prn pain. 30 tablet 3  . metoprolol (LOPRESSOR) 50 MG tablet TAKE ONE TABLET BY MOUTH TWICE DAILY  180 tablet 0  . mupirocin ointment (BACTROBAN) 2 % Apply 1 application topically 2 (two) times daily. 30 g 1  . omeprazole (PRILOSEC) 20 MG capsule TAKE ONE CAPSULE BY MOUTH ONE TIME DAILY 30 capsule 5  . sulfamethoxazole-trimethoprim  (SEPTRA DS) 800-160 MG per tablet Take 2 tablets by mouth 2 (two) times daily. 28 tablet 0  . testosterone cypionate (DEPOTESTOTERONE CYPIONATE) 200 MG/ML injection Inject 1 mL (200 mg total) into the muscle every 14 (fourteen) days. 10 mL 0   No Known Allergies   Exam:  BP 119/73 mmHg  Pulse 71  Temp(Src) 97.7 F (36.5 C) (Oral)  Ht 5\' 7"  (1.702 m)  Wt 220 lb (99.791 kg)  BMI 34.45 kg/m2  SpO2 97% Gen: Well NAD Again: Posterior scalp tender erythematous papules. No pus expressed. Consistent with folliculitis.  No results found for this or any previous visit (from the past 24 hour(s)). No results found.  Assessment and Plan: 54 y.o. male with folliculitis. Treat with Bactrim and mupirocin ointment. Return as needed.  Discussed warning signs or symptoms. Please see discharge instructions. Patient expresses understanding.     Rodolph BongEvan S Felica Chargois, MD 08/10/14 [phone]

## 2014-08-10 NOTE — Discharge Instructions (Signed)
Thank you for coming in today. ° ° °Folliculitis  °Folliculitis is redness, soreness, and swelling (inflammation) of the hair follicles. This condition can occur anywhere on the body. People with weakened immune systems, diabetes, or obesity have a greater risk of getting folliculitis. °CAUSES °· Bacterial infection. This is the most common cause. °· Fungal infection. °· Viral infection. °· Contact with certain chemicals, especially oils and tars. °Long-term folliculitis can result from bacteria that live in the nostrils. The bacteria may trigger multiple outbreaks of folliculitis over time. °SYMPTOMS °Folliculitis most commonly occurs on the scalp, thighs, legs, back, buttocks, and areas where hair is shaved frequently. An early sign of folliculitis is a small, white or yellow, pus-filled, itchy lesion (pustule). These lesions appear on a red, inflamed follicle. They are usually less than 0.2 inches (5 mm) wide. When there is an infection of the follicle that goes deeper, it becomes a boil or furuncle. A group of closely packed boils creates a larger lesion (carbuncle). Carbuncles tend to occur in hairy, sweaty areas of the body. °DIAGNOSIS  °Your caregiver can usually tell what is wrong by doing a physical exam. A sample may be taken from one of the lesions and tested in a lab. This can help determine what is causing your folliculitis. °TREATMENT  °Treatment may include: °· Applying warm compresses to the affected areas. °· Taking antibiotic medicines orally or applying them to the skin. °· Draining the lesions if they contain a large amount of pus or fluid. °· Laser hair removal for cases of long-lasting folliculitis. This helps to prevent regrowth of the hair. °HOME CARE INSTRUCTIONS °· Apply warm compresses to the affected areas as directed by your caregiver. °· If antibiotics are prescribed, take them as directed. Finish them even if you start to feel better. °· You may take over-the-counter medicines to  relieve itching. °· Do not shave irritated skin. °· Follow up with your caregiver as directed. °SEEK IMMEDIATE MEDICAL CARE IF:  °· You have increasing redness, swelling, or pain in the affected area. °· You have a fever. °MAKE SURE YOU: °· Understand these instructions. °· Will watch your condition. °· Will get help right away if you are not doing well or get worse. °Document Released: 08/30/2001 Document Revised: 12/21/2011 Document Reviewed: 09/21/2011 °Exit[employer]® Patient Information ©2015 [employer]. This information is not intended to replace advice given to you by your health care provider. Make sure you discuss any questions you have with your health care provider. ° °

## 2014-08-10 NOTE — ED Notes (Signed)
Pt complains of bumps that itch on the back of his head.  They started 2 months ago.

## 2014-08-11 NOTE — Telephone Encounter (Signed)
Left message for patient to return call.

## 2014-08-11 NOTE — Telephone Encounter (Signed)
Medication was denied. See other telephone note.

## 2014-08-12 ENCOUNTER — Other Ambulatory Visit: Payer: Self-pay | Admitting: Sports Medicine

## 2014-08-12 NOTE — Telephone Encounter (Signed)
Due for f/u at the end of this month. (last visit 11/15)

## 2014-08-26 ENCOUNTER — Ambulatory Visit (INDEPENDENT_AMBULATORY_CARE_PROVIDER_SITE_OTHER): Payer: BLUE CROSS/BLUE SHIELD | Admitting: Sports Medicine

## 2014-08-26 ENCOUNTER — Encounter: Payer: Self-pay | Admitting: Sports Medicine

## 2014-08-26 VITALS — BP 138/87 | HR 87 | Ht 67.0 in | Wt 224.0 lb

## 2014-08-26 DIAGNOSIS — R300 Dysuria: Secondary | ICD-10-CM | POA: Diagnosis not present

## 2014-08-26 DIAGNOSIS — L738 Other specified follicular disorders: Secondary | ICD-10-CM

## 2014-08-26 DIAGNOSIS — N5201 Erectile dysfunction due to arterial insufficiency: Secondary | ICD-10-CM

## 2014-08-26 DIAGNOSIS — E291 Testicular hypofunction: Secondary | ICD-10-CM

## 2014-08-26 MED ORDER — VARDENAFIL HCL 20 MG PO TABS: 20.0000 mg | ORAL_TABLET | Freq: Every day | ORAL | Status: DC | PRN

## 2014-08-26 MED ORDER — TESTOSTERONE CYPIONATE 200 MG/ML IM SOLN
200.0000 mg | INTRAMUSCULAR | Status: DC
  Administered 2014-08-26: 200 mg via INTRAMUSCULAR

## 2014-08-26 MED ORDER — TRETINOIN 0.1 % EX CREA: TOPICAL_CREAM | Freq: Every day | CUTANEOUS | Status: DC

## 2014-08-26 NOTE — Assessment & Plan Note (Signed)
In the posterior scalp, no infection today. He was well treated by Dr. Denyse Amassorey with Septra and topical mupirocin. I'm going to add topical vitamin A cream, and he will avoid cutting his hair for the next couple of weeks. He does have several ingrown hairs.

## 2014-08-26 NOTE — Progress Notes (Signed)
  Subjective:    CC: Follow-up   HPI: Eduardo Mclean returns, he was seen in urgent care with folliculitis and treated appropriately with Septra and topical mupirocin which resolved his symptoms. The pain is gone however he continues to have multiple papules on the posterior aspect of his scalp. He does shave his head often, and keeps tight close to the skin.  Dysuria: Slight pain, burning with urination, also has some obstructive symptoms with hesitancy, dribbling, weak stream and a couple of episodes of nocturia per night. No discharge, no constitutional symptoms, no pain with bowel movement.  Past medical history, Surgical history, Family history not pertinant except as noted below, Social history, Allergies, and medications have been entered into the medical record, reviewed, and no changes needed.   Review of Systems: No fevers, chills, night sweats, weight loss, chest pain, or shortness of breath.   Objective:    General: Well Developed, well nourished, and in no acute distress.  Neuro: Alert and oriented x3, extra-ocular muscles intact, sensation grossly intact.  HEENT: Normocephalic, atraumatic, pupils equal round reactive to light, neck supple, no masses, no lymphadenopathy, thyroid nonpalpable.  Skin: Warm and dry, no rashes.There are multiple firm papules without tenderness, erythema, or induration on the posterior scalp, these appear to be ingrown hairs  Cardiac: Regular rate and rhythm, no murmurs rubs or gallops, no lower extremity edema.  Respiratory: Clear to auscultation bilaterally. Not using accessory muscles, speaking in full sentences.  Impression and Recommendations:

## 2014-08-26 NOTE — Patient Instructions (Signed)
°  Ingrown Hair °An ingrown hair is a hair that curls and re-enters the skin instead of growing straight out of the skin. It happens most often with curly hair. It is usually more severe in the neck area, but it can occur in any shaved area, including the beard area, groin, scalp, and legs. An ingrown hair may cause small pockets of infection. °CAUSES  °Shaving closely, tweezing, or waxing, especially curly hair. Using hair removal creams can sometimes lead to ingrown hairs, especially in the groin. °SYMPTOMS  °· Small bumps on the skin. The bumps may be filled with pus. °· Pain. °· Itching. °DIAGNOSIS  °Your caregiver can usually tell what is wrong by doing a physical exam. °TREATMENT  °If there is a severe infection, your caregiver may prescribe antibiotic medicines. Laser hair removal may also be done to help prevent regrowth of the hair. °HOME CARE INSTRUCTIONS  °· Do not shave irritated skin. You may start shaving again once the irritation has gone away. °· If you are prone to ingrown hairs, consider not shaving as much as possible. °· If antibiotics are prescribed, take them as directed. Finish them even if you start to feel better. °· You may use a facial sponge in a gentle circular motion to help dislodge ingrown hairs on the face. °· You may use a hair removal cream weekly, especially on the legs and underarms. Stop using the cream if it irritates your skin. Use caution when using hair removal creams in the groin area. °SHAVING INSTRUCTIONS AFTER TREATMENT °· Shower before shaving. Keep areas to be shaved packed in warm, moist wraps for several minutes before shaving. The warm, moist environment helps soften the hairs and makes ingrown hairs less likely to occur. °· Use thick shaving gels. °· Use a bump fighter razor that cuts hair slightly above the skin level or use an electric shaver with a longer shave setting. °· Shave in the direction of hair growth. Avoid making multiple razor strokes. °· Use  moisturizing lotions after shaving. °Document Released: 09/27/2000 Document Revised: 12/21/2011 Document Reviewed: 09/21/2011 °[employer]® Patient Information ©2015 [employer]. This information is not intended to replace advice given to you by your health care provider. Make sure you discuss any questions you have with your health care provider. ° °

## 2014-08-26 NOTE — Assessment & Plan Note (Signed)
Refilling Levitra 

## 2014-08-26 NOTE — Assessment & Plan Note (Signed)
Urinalysis. Urine GC and chlamydia. Culture. If persistent obstructive symptoms we can try Flomax versus decreasing his testosterone supplementation.

## 2014-08-26 NOTE — Assessment & Plan Note (Signed)
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

## 2014-08-27 LAB — URINALYSIS
Bilirubin Urine: NEGATIVE
Glucose, UA: NEGATIVE mg/dL
Hgb urine dipstick: NEGATIVE
Ketones, ur: NEGATIVE mg/dL
Leukocytes, UA: NEGATIVE
Nitrite: NEGATIVE
Protein, ur: NEGATIVE mg/dL
Specific Gravity, Urine: 1.017 (ref 1.005–1.030)
Urobilinogen, UA: 1 mg/dL (ref 0.0–1.0)
pH: 6 (ref 5.0–8.0)

## 2014-08-27 LAB — GC/CHLAMYDIA PROBE AMP, URINE
Chlamydia, Swab/Urine, PCR: NEGATIVE
GC Probe Amp, Urine: NEGATIVE

## 2014-08-28 LAB — URINE CULTURE
Colony Count: NO GROWTH
Organism ID, Bacteria: NO GROWTH

## 2014-09-05 ENCOUNTER — Telehealth: Payer: Self-pay

## 2014-09-05 NOTE — Telephone Encounter (Signed)
Spoke to [facility] about the Tretinoin PA and they stated they filled the medication for the generic the cost was 29.00 and patient has not picked up yet. I went ahead and did the PA through cover my meds waiting on auth. - CF

## 2014-09-11 ENCOUNTER — Telehealth: Payer: Self-pay

## 2014-09-11 ENCOUNTER — Other Ambulatory Visit: Payer: Self-pay

## 2014-09-11 MED ORDER — FEXOFENADINE HCL 180 MG PO TABS: 180.0000 mg | ORAL_TABLET | Freq: Every day | ORAL | Status: DC

## 2014-09-11 MED ORDER — FLUTICASONE PROPIONATE 50 MCG/ACT NA SUSP: 2.0000 | Freq: Every day | NASAL | Status: DC

## 2014-09-11 NOTE — Telephone Encounter (Signed)
[name] would like to switch to Zyrtec. Please advise.

## 2014-09-12 ENCOUNTER — Ambulatory Visit: Payer: BLUE CROSS/BLUE SHIELD | Admitting: Sports Medicine

## 2014-09-12 ENCOUNTER — Other Ambulatory Visit: Payer: Self-pay | Admitting: Sports Medicine

## 2014-09-12 MED ORDER — TRETINOIN 0.1 % EX CREA: TOPICAL_CREAM | Freq: Every day | CUTANEOUS | Status: DC

## 2014-09-12 MED ORDER — CETIRIZINE HCL 10 MG PO TABS: 10.0000 mg | ORAL_TABLET | Freq: Every day | ORAL | Status: DC

## 2014-09-12 NOTE — Telephone Encounter (Signed)
Patient advised.

## 2014-09-12 NOTE — Telephone Encounter (Signed)
Done

## 2014-09-23 ENCOUNTER — Ambulatory Visit: Payer: BLUE CROSS/BLUE SHIELD | Admitting: Sports Medicine

## 2014-09-24 ENCOUNTER — Other Ambulatory Visit: Payer: Self-pay | Admitting: Sports Medicine

## 2014-10-20 ENCOUNTER — Other Ambulatory Visit: Payer: Self-pay | Admitting: Sports Medicine

## 2014-10-21 ENCOUNTER — Other Ambulatory Visit: Payer: Self-pay | Admitting: Sports Medicine

## 2014-11-20 ENCOUNTER — Other Ambulatory Visit: Payer: Self-pay | Admitting: Sports Medicine

## 2014-11-23 ENCOUNTER — Other Ambulatory Visit: Payer: Self-pay | Admitting: Sports Medicine

## 2014-11-29 ENCOUNTER — Emergency Department
Admission: EM | Admit: 2014-11-29 | Discharge: 2014-11-29 | Disposition: A | Discharge status: Home / Self Care | Payer: BLUE CROSS/BLUE SHIELD | Source: Home / Self Care | Attending: Family Medicine | Admitting: Family Medicine

## 2014-11-29 ENCOUNTER — Emergency Department (INDEPENDENT_AMBULATORY_CARE_PROVIDER_SITE_OTHER): Payer: BLUE CROSS/BLUE SHIELD

## 2014-11-29 ENCOUNTER — Encounter: Payer: Self-pay | Admitting: Emergency Medicine

## 2014-11-29 DIAGNOSIS — S46012A Strain of muscle(s) and tendon(s) of the rotator cuff of left shoulder, initial encounter: Secondary | ICD-10-CM | POA: Diagnosis not present

## 2014-11-29 DIAGNOSIS — M25512 Pain in left shoulder: Secondary | ICD-10-CM | POA: Diagnosis not present

## 2014-11-29 MED ORDER — TRAMADOL HCL 50 MG PO TABS: 50.0000 mg | ORAL_TABLET | Freq: Every evening | ORAL | Status: DC | PRN

## 2014-11-29 MED ORDER — PREDNISONE 20 MG PO TABS: 20.0000 mg | ORAL_TABLET | Freq: Two times a day (BID) | ORAL | Status: DC

## 2014-11-29 NOTE — ED Provider Notes (Signed)
CSN: [id number]     Arrival date & time 11/29/14  1606 History   First MD Initiated Contact with Patient 11/29/14 1659     Chief Complaint  Patient presents with  . Shoulder Pain      HPI Comments: Patient states that he lifted a heavy seat about one week ago, and later developed pain in his left shoulder that has persisted.  He has difficulty raising his left arm.   Patient is a 54 y.o. male presenting with shoulder pain. The history is provided by the patient.  Shoulder Pain Location:  Shoulder Time since incident:  1 week Injury: no   Shoulder location:  L shoulder Pain details:    Quality:  Aching   Radiates to:  Does not radiate   Severity:  Moderate   Onset quality:  Gradual   Duration:  1 week   Timing:  Constant   Progression:  Worsening Chronicity:  New Dislocation: no   Prior injury to area:  No Relieved by:  Nothing Worsened by:  Movement Ineffective treatments:  NSAIDs Associated symptoms: decreased range of motion and stiffness   Associated symptoms: no back pain, no muscle weakness, no neck pain, no numbness, no swelling and no tingling     Past Medical History  Diagnosis Date  . GERD (gastroesophageal reflux disease)   . Gout   . Hypertension   . Seasonal allergies    Past Surgical History  Procedure Laterality Date  . Back surgery     Family History  Problem Relation Age of Onset  . Hypertension Father    History  Substance Use Topics  . Smoking status: Current Some Day Smoker  . Smokeless tobacco: Never Used  . Alcohol Use: Yes    Review of Systems  Musculoskeletal: Positive for stiffness. Negative for back pain and neck pain.  All other systems reviewed and are negative.   Allergies  Review of patient's allergies indicates no known allergies.  Home Medications   Prior to Admission medications   Medication Sig Start Date End Date Taking? Authorizing Provider  allopurinol (ZYLOPRIM) 300 MG tablet TAKE ONE TABLET BY MOUTH ONE TIME  DAILY 11/20/14   Monica Bectonhomas J Thekkekandam, MD  amLODipine (NORVASC) 10 MG tablet TAKE ONE-HALF TABLET BY MOUTH DAILY 11/24/14   Monica Bectonhomas J Thekkekandam, MD  cetirizine (ZYRTEC) 10 MG tablet Take 1 tablet (10 mg total) by mouth daily. 09/12/14   Monica Bectonhomas J Thekkekandam, MD  fluticasone (FLONASE) 50 MCG/ACT nasal spray Place 2 sprays into both nostrils daily. 09/11/14   Monica Bectonhomas J Thekkekandam, MD  hydrocortisone (ANUSOL-HC) 25 MG suppository Place 1 suppository (25 mg total) rectally 2 (two) times daily. 09/07/13   Lattie HawStephen A Beese, MD  losartan (COZAAR) 100 MG tablet TAKE ONE TABLET BY MOUTH ONE TIME DAILY     Monica Bectonhomas J Thekkekandam, MD  meloxicam (MOBIC) 15 MG tablet One tab PO qAM with breakfast for 2 weeks, then daily prn pain. 07/16/13   Monica Bectonhomas J Thekkekandam, MD  metoprolol (LOPRESSOR) 50 MG tablet TAKE ONE TABLET BY MOUTH TWICE DAILY  08/02/14   Monica Bectonhomas J Thekkekandam, MD  mupirocin ointment (BACTROBAN) 2 % Apply 1 application topically 2 (two) times daily. 08/10/14   Rodolph BongEvan S Corey, MD  omeprazole (PRILOSEC) 20 MG capsule TAKE ONE CAPSULE BY MOUTH ONE TIME DAILY 11/20/14   Monica Bectonhomas J Thekkekandam, MD  omeprazole (PRILOSEC) 20 MG capsule TAKE ONE CAPSULE BY MOUTH ONE TIME DAILY 11/24/14   Monica Bectonhomas J Thekkekandam, MD  predniSONE (DELTASONE) 20  MG tablet Take 1 tablet (20 mg total) by mouth 2 (two) times daily. Take with food. 11/29/14   Lattie Haw, MD  sulfamethoxazole-trimethoprim (SEPTRA DS) 800-160 MG per tablet Take 2 tablets by mouth 2 (two) times daily. 08/10/14   Rodolph Bong, MD  testosterone cypionate (DEPOTESTOTERONE CYPIONATE) 200 MG/ML injection Inject 1 mL (200 mg total) into the muscle every 14 (fourteen) days. 08/14/13   Monica Becton, MD  traMADol (ULTRAM) 50 MG tablet Take 1 tablet (50 mg total) by mouth at bedtime as needed. 11/29/14   Lattie Haw, MD  tretinoin (RETIN-A) 0.1 % cream Apply topically at bedtime. To affected area on head 09/12/14   Monica Becton, MD  vardenafil (LEVITRA) 20  MG tablet Take 1 tablet (20 mg total) by mouth daily as needed for erectile dysfunction. 08/26/14   Monica Becton, MD   BP 134/86 mmHg  Pulse 69  Temp(Src) 97.6 F (36.4 C) (Oral)  Resp 1  Ht  (1.702 m)  Wt 220 lb (99.791 kg)  BMI 34.45 kg/m2  SpO2 97% Physical Exam  Constitutional: He appears well-developed and well-nourished. No distress.  Patient is obese (BMI 34.5)   HENT:  Head: Normocephalic.  Eyes: Pupils are equal, round, and reactive to light.  Neck: Normal range of motion.  Cardiovascular: Normal heart sounds.   Pulmonary/Chest: Breath sounds normal.  Abdominal: There is no tenderness.  Musculoskeletal:       Left shoulder: He exhibits decreased range of motion, tenderness, pain and decreased strength. He exhibits no bony tenderness, no swelling, no deformity, no laceration and normal pulse.       Arms: Left upper back has tenderness to palpation over trapezius muscle extending to medial and inferior edges of left scapula. Left shoulder:  Unable to actively abduct above horizontal.  Can passively abduct to about 5 degrees above horizontal.  Apley's test positive.  Positive empty can.  Decreased external rotation range of motion.  Relatively preserved internal/external rotational strength.  Distal neurovascular function is intact.   Neurological: He is alert.  Skin: Skin is warm and dry. No rash noted.  Nursing note and vitals reviewed.   ED Course  Procedures   none Imaging Review Dg Shoulder Left  11/29/2014   CLINICAL DATA:  Acute onset of left shoulder pain for 1 week. Initial encounter.  EXAM: LEFT SHOULDER - 2+ VIEW  COMPARISON:  None.  FINDINGS: There is no evidence of fracture or dislocation. The left humeral head is seated within the glenoid fossa. The acromioclavicular joint is unremarkable in appearance. Minimal degenerative change is noted at the acromion. No significant soft tissue abnormalities are seen. The visualized portions of the left lung  are clear.  IMPRESSION: No evidence of fracture or dislocation.   Electronically Signed   By: Roanna Raider M.D.   On: 11/29/2014 17:38     MDM   1. Rotator cuff strain, left, initial encounter    Begin prednisone burst. Rx for tramadol at bedtime. Apply ice pack for 20 to 30 minutes, 3 to 4 times daily  Continue until pain decreases. Avoid overhead movements with left shoulder/arm.  Begin range of motion exercises as tolerated.  After finishing prednisone, may begin indomethacin as prescribed (has Rx at home). Followup with Dr. Rodney [facility] ([facility]) in one week.    Lattie Haw, MD 12/03/14 2110

## 2014-11-29 NOTE — Discharge Instructions (Signed)
Apply ice pack for 20 to 30 minutes, 3 to 4 times daily  Continue until pain decreases. Avoid overhead movements with left shoulder/arm.  Begin range of motion exercises as tolerated.  After finishing prednisone, may begin indomethacin as prescribed.   Rotator Cuff Injury Rotator cuff injury is any type of injury to the set of muscles and tendons that make up the stabilizing unit of your shoulder. This unit holds the ball of your upper arm bone (humerus) in the socket of your shoulder blade (scapula).  CAUSES Injuries to your rotator cuff most commonly come from sports or activities that cause your arm to be moved repeatedly over your head. Examples of this include throwing, weight lifting, swimming, or racquet sports. Long lasting (chronic) irritation of your rotator cuff can cause soreness and swelling (inflammation), bursitis, and eventual damage to your tendons, such as a tear (rupture). SIGNS AND SYMPTOMS Acute rotator cuff tear:  Sudden tearing sensation followed by severe pain shooting from your upper shoulder down your arm toward your elbow.  Decreased range of motion of your shoulder because of pain and muscle spasm.  Severe pain.  Inability to raise your arm out to the side because of pain and loss of muscle power (large tears). Chronic rotator cuff tear:  Pain that usually is worse at night and may interfere with sleep.  Gradual weakness and decreased shoulder motion as the pain worsens.  Decreased range of motion. Rotator cuff tendinitis:  Deep ache in your shoulder and the outside upper arm over your shoulder.  Pain that comes on gradually and becomes worse when lifting your arm to the side or turning it inward. DIAGNOSIS Rotator cuff injury is diagnosed through a medical history, physical exam, and imaging exam. The medical history helps determine the type of rotator cuff injury. Your health care provider will look at your injured shoulder, feel the injured area, and ask  you to move your shoulder in different positions. X-ray exams typically are done to rule out other causes of shoulder pain, such as fractures. MRI is the exam of choice for the most severe shoulder injuries because the images show muscles and tendons.  TREATMENT  Chronic tear:  Medicine for pain, such as acetaminophen or ibuprofen.  Physical therapy and range-of-motion exercises may be helpful in maintaining shoulder function and strength.  Steroid injections into your shoulder joint.  Surgical repair of the rotator cuff if the injury does not heal with noninvasive treatment. Acute tear:  Anti-inflammatory medicines such as ibuprofen and naproxen to help reduce pain and swelling.  A sling to help support your arm and rest your rotator cuff muscles. Long-term use of a sling is not advised. It may cause significant stiffening of the shoulder joint.  Surgery may be considered within a few weeks, especially in younger, active people, to return the shoulder to full function.  Indications for surgical treatment include the following:  Age younger than 60 years.  Rotator cuff tears that are complete.  Physical therapy, rest, and anti-inflammatory medicines have been used for 6-8 weeks, with no improvement.  Employment or sporting activity that requires constant shoulder use. Tendinitis:  Anti-inflammatory medicines such as ibuprofen and naproxen to help reduce pain and swelling.  A sling to help support your arm and rest your rotator cuff muscles. Long-term use of a sling is not advised. It may cause significant stiffening of the shoulder joint.  Severe tendinitis may require:  Steroid injections into your shoulder joint.  Physical therapy.  Surgery.  HOME CARE INSTRUCTIONS   Apply ice to your injury:  Put ice in a plastic bag.  Place a towel between your skin and the bag.  Leave the ice on for 20 minutes, 2-3 times a day.  If you have a shoulder immobilizer (sling and  straps), wear it until told otherwise by your health care provider.  You may want to sleep on several pillows or in a recliner at night to lessen swelling and pain.  Only take over-the-counter or prescription medicines for pain, discomfort, or fever as directed by your health care provider.  Do simple hand squeezing exercises with a soft rubber ball to decrease hand swelling. SEEK MEDICAL CARE IF:   Your shoulder pain increases, or new pain or numbness develops in your arm, hand, or fingers.  Your hand or fingers are colder than your other hand. SEEK IMMEDIATE MEDICAL CARE IF:   Your arm, hand, or fingers are numb or tingling.  Your arm, hand, or fingers are increasingly swollen and painful, or they turn white or blue. MAKE SURE YOU:  Understand these instructions.  Will watch your condition.  Will get help right away if you are not doing well or get worse. Document Released: 06/18/2000 Document Revised: 06/26/2013 Document Reviewed: 01/31/2013 [facility] Patient Information 2015 [place], [place]. This information is not intended to replace advice given to you by your health care provider. Make sure you discuss any questions you have with your health care provider.

## 2014-11-29 NOTE — ED Notes (Signed)
Reports lifting heavy object 1 week ago that challenged his arm strength; afterwards has had pain in left shoulder with some limitations on movement.

## 2015-01-13 ENCOUNTER — Other Ambulatory Visit: Payer: Self-pay | Admitting: Sports Medicine

## 2015-01-21 ENCOUNTER — Other Ambulatory Visit: Payer: Self-pay | Admitting: Sports Medicine

## 2015-02-02 ENCOUNTER — Other Ambulatory Visit: Payer: Self-pay | Admitting: Sports Medicine

## 2015-02-14 ENCOUNTER — Emergency Department (INDEPENDENT_AMBULATORY_CARE_PROVIDER_SITE_OTHER): Payer: BLUE CROSS/BLUE SHIELD

## 2015-02-14 ENCOUNTER — Emergency Department (INDEPENDENT_AMBULATORY_CARE_PROVIDER_SITE_OTHER)
Admission: EM | Admit: 2015-02-14 | Discharge: 2015-02-14 | Disposition: A | Discharge status: Home / Self Care | Payer: BLUE CROSS/BLUE SHIELD | Source: Home / Self Care | Attending: Family Medicine | Admitting: Family Medicine

## 2015-02-14 ENCOUNTER — Encounter: Payer: Self-pay | Admitting: *Deleted

## 2015-02-14 DIAGNOSIS — M25562 Pain in left knee: Secondary | ICD-10-CM

## 2015-02-14 DIAGNOSIS — M129 Arthropathy, unspecified: Secondary | ICD-10-CM

## 2015-02-14 DIAGNOSIS — M1712 Unilateral primary osteoarthritis, left knee: Secondary | ICD-10-CM

## 2015-02-14 DIAGNOSIS — M5442 Lumbago with sciatica, left side: Secondary | ICD-10-CM | POA: Diagnosis not present

## 2015-02-14 MED ORDER — MELOXICAM 7.5 MG PO TABS: 7.5000 mg | ORAL_TABLET | Freq: Every day | ORAL | Status: DC

## 2015-02-14 MED ORDER — HYDROCODONE-ACETAMINOPHEN 5-325 MG PO TABS: 1.0000 | ORAL_TABLET | ORAL | Status: DC | PRN

## 2015-02-14 NOTE — ED Notes (Signed)
Pt c/o left frontal knee pain without injury x 2 weeks. He has been moving recently. Pain radiates up leg at times.

## 2015-02-14 NOTE — Discharge Instructions (Signed)
Norco (hydrocodone-acetaminophen) is a narcotic pain medication, do not combine these medications with others containing tylenol. While taking, do not drink alcohol, drive, or perform any other activities that requires focus while taking these medications.  Meloxicam (Mobic) is an antiinflammatory to help with pain and inflammation.  Do not take ibuprofen, Advil, Aleve, or any other medications that contain NSAIDs while taking meloxicam as this may cause stomach upset or even ulcers if taken in large amounts for an extended period of time.

## 2015-02-14 NOTE — ED Provider Notes (Signed)
CSN: [id number]     Arrival date & time 02/14/15  1613 History   First MD Initiated Contact with Patient 02/14/15 1622     Chief Complaint  Patient presents with  . Knee Pain   (Consider location/radiation/quality/duration/timing/severity/associated sxs/prior Treatment) HPI Pt is a 54yo male with hx of gout, presenting to [facility] with c/o gradually worsening Left knee pain for 2 weeks. Pain is aching and throbbing, 8/10 at worse, waxing and waning, worse with palpation and ambulation. States pain radiates up his Left leg into his back and also c/o bilateral lower back pain that is aching and sore. He has not tried anything for pain. Denies known injury but states he has been moving over the last week and has been up and down stairs. He believes this has exacerbated his pain. Denies wearing a knee brace. Denies prior surgery to his back or knee.  States he has not f/u with his PCP, Dr. Benjamin Stain, in several months but states he has not seen him specifically for his Left knee pain in the past that he can recall. Denies fever, chills. Denies change in skin color.  Past Medical History  Diagnosis Date  . GERD (gastroesophageal reflux disease)   . Gout   . Hypertension   . Seasonal allergies    Past Surgical History  Procedure Laterality Date  . Back surgery     Family History  Problem Relation Age of Onset  . Hypertension Father    Social History  Substance Use Topics  . Smoking status: Current Some Day Smoker  . Smokeless tobacco: Never Used  . Alcohol Use: Yes    Review of Systems  Musculoskeletal: Positive for myalgias, back pain (lower), joint swelling and arthralgias. Negative for gait problem.       Left knee  Skin: Negative for color change and wound.  Neurological: Negative for weakness and numbness.    Allergies  Review of patient's allergies indicates no known allergies.  Home Medications   Prior to Admission medications   Medication Sig Start Date End Date Taking?  Authorizing Provider  allopurinol (ZYLOPRIM) 300 MG tablet TAKE ONE TABLET BY MOUTH ONE TIME DAILY 01/13/15   Monica Becton, MD  amLODipine (NORVASC) 10 MG tablet TAKE ONE-HALF TABLET BY MOUTH DAILY 11/24/14   Monica Becton, MD  cetirizine (ZYRTEC) 10 MG tablet Take 1 tablet (10 mg total) by mouth daily. 09/12/14   Monica Becton, MD  fluticasone (FLONASE) 50 MCG/ACT nasal spray Place 2 sprays into both nostrils daily. 09/11/14   Monica Becton, MD  HYDROcodone-acetaminophen (NORCO/VICODIN) 5-325 MG per tablet Take 1 tablet by mouth every 4 (four) hours as needed. 02/14/15   Junius Finner, PA-C  hydrocortisone (ANUSOL-HC) 25 MG suppository Place 1 suppository (25 mg total) rectally 2 (two) times daily. 09/07/13   Lattie Haw, MD  losartan (COZAAR) 100 MG tablet TAKE ONE TABLET BY MOUTH ONE TIME DAILY 02/03/15   Monica Becton, MD  meloxicam (MOBIC) 15 MG tablet One tab PO qAM with breakfast for 2 weeks, then daily prn pain. 07/16/13   Monica Becton, MD  meloxicam (MOBIC) 7.5 MG tablet Take 1 tablet (7.5 mg total) by mouth daily. 02/14/15   Junius Finner, PA-C  metoprolol (LOPRESSOR) 50 MG tablet TAKE ONE TABLET BY MOUTH TWICE DAILY  08/02/14   Monica Becton, MD  mupirocin ointment (BACTROBAN) 2 % Apply 1 application topically 2 (two) times daily. 08/10/14   Rodolph Bong, MD  omeprazole (  PRILOSEC) 20 MG capsule TAKE ONE CAPSULE BY MOUTH ONE TIME DAILY 01/21/15   Monica Becton, MD  testosterone cypionate (DEPOTESTOTERONE CYPIONATE) 200 MG/ML injection Inject 1 mL (200 mg total) into the muscle every 14 (fourteen) days. 08/14/13   Monica Becton, MD  traMADol (ULTRAM) 50 MG tablet Take 1 tablet (50 mg total) by mouth at bedtime as needed. 11/29/14   Lattie Haw, MD  tretinoin (RETIN-A) 0.1 % cream Apply topically at bedtime. To affected area on head 09/12/14   Monica Becton, MD  vardenafil (LEVITRA) 20 MG tablet Take 1 tablet (20 mg total)  by mouth daily as needed for erectile dysfunction. 08/26/14   Monica Becton, MD   BP 142/96 mmHg  Pulse 68  Resp 14  Wt 234 lb (106.142 kg)  SpO2 97% Physical Exam  Constitutional: He is oriented to person, place, and time. He appears well-developed and well-nourished.  HENT:  Head: Normocephalic and atraumatic.  Eyes: EOM are normal.  Neck: Normal range of motion.  Cardiovascular: Normal rate.   Pulmonary/Chest: Effort normal.  Musculoskeletal: Normal range of motion. He exhibits edema and tenderness.  Left knee: mild edema. Tenderness to joint space, worse along medial aspect. FROM with increased pain on Full flexion and extension. No crepitus.  Mild tenderness to lumbar spine and bilateral muscles. No step offs or crepitus.  Neurological: He is alert and oriented to person, place, and time.  Antalgic gait  Skin: Skin is warm and dry.  Left knee: Skin in tact. No ecchymosis, erythema, or warmth. No red streaking, induration, or evidence of underlying infection.   Psychiatric: He has a normal mood and affect. His behavior is normal.  Nursing note and vitals reviewed.   ED Course  Procedures (including critical care time) Labs Review Labs Reviewed - No data to display  Imaging Review Dg Knee Complete 4 Views Left  02/14/2015   CLINICAL DATA:  Pain left knee for 1 week  EXAM: LEFT KNEE - COMPLETE 4+ VIEW  COMPARISON:  None.  FINDINGS: No acute fracture or dislocation is noted. No joint effusion is seen. Very minimal patellofemoral degenerative changes are seen. Minimal changes are also noted in the medial joint space. No soft tissue abnormality is seen.  IMPRESSION: Minimal degenerative change without acute abnormality.   Electronically Signed   By: Alcide Clever M.D.   On: 02/14/2015 17:36     MDM   1. Arthritis of left knee   2. Left knee pain   3. Left-sided low back pain with left-sided sciatica    Pt c/o left knee pain with swelling and lower back pain after  moving recently. No known injury. No red flag symptoms. Hx of gout however exam not c/w gout. More c/w musculoskeletal pain/over use injury. Plain films Left knee: significant for minimal degenerative change w/o acute abnormality  Discussed imaging with pt. Offered knee sleeve, pt states he will purchase one OTC. Rx: norco and mobic.  Home care instructions provided. Advised to f/u with Dr. Benjamin Stain or Dr. Denyse Amass in Sports Medicine in 1-2 weeks for further evaluation and treatment of Left knee pain if not improving. Patient verbalized understanding and agreement with treatment plan.   Junius Finner, PA-C 02/14/15 1752

## 2015-03-05 ENCOUNTER — Other Ambulatory Visit: Payer: Self-pay | Admitting: Sports Medicine

## 2015-03-25 ENCOUNTER — Other Ambulatory Visit: Payer: Self-pay | Admitting: Sports Medicine

## 2015-05-16 ENCOUNTER — Other Ambulatory Visit: Payer: Self-pay | Admitting: Sports Medicine

## 2015-06-05 ENCOUNTER — Other Ambulatory Visit: Payer: Self-pay | Admitting: Sports Medicine

## 2015-06-20 ENCOUNTER — Other Ambulatory Visit: Payer: Self-pay | Admitting: Sports Medicine

## 2015-07-08 ENCOUNTER — Other Ambulatory Visit: Payer: Self-pay | Admitting: Sports Medicine

## 2015-07-22 ENCOUNTER — Other Ambulatory Visit: Payer: Self-pay

## 2015-07-22 MED ORDER — AMLODIPINE BESYLATE 10 MG PO TABS: 5.0000 mg | ORAL_TABLET | Freq: Every day | ORAL | Status: DC

## 2015-07-26 ENCOUNTER — Other Ambulatory Visit: Payer: Self-pay | Admitting: Sports Medicine

## 2015-08-06 IMAGING — CR DG WRIST COMPLETE 3+V*R*
4 series · 4 of 4 positions shown · non-contrast
Comparison: None.

CLINICAL DATA: Wrist pain.  Fall last p.m..  Initial evaluation .

EXAM:
RIGHT WRIST - COMPLETE 3+ VIEW

[view not recorded (1 of 4)]
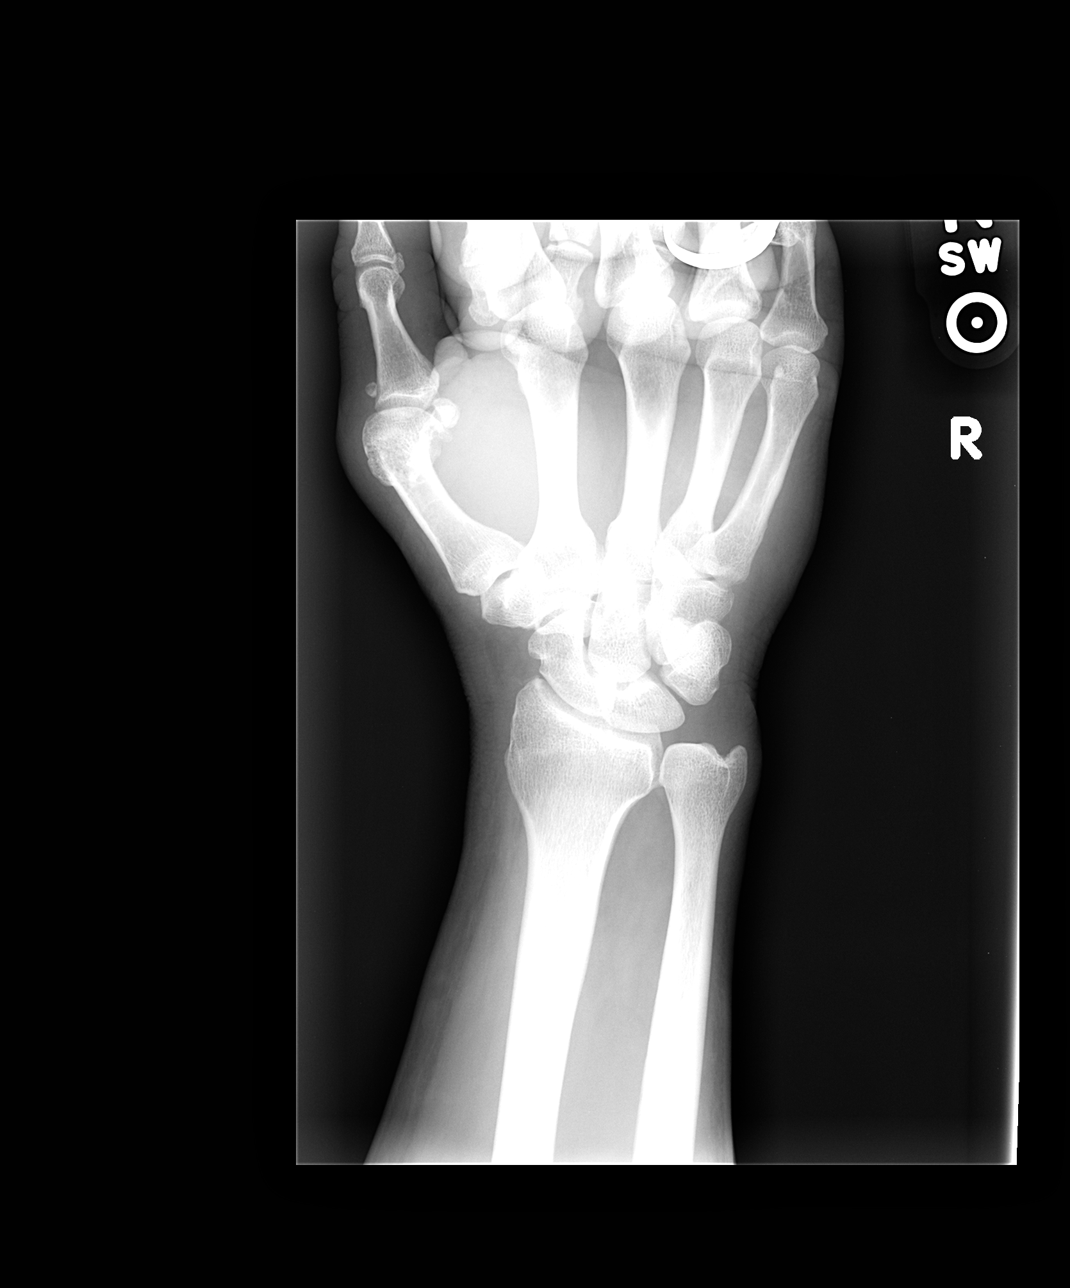

[view not recorded (2 of 4)]
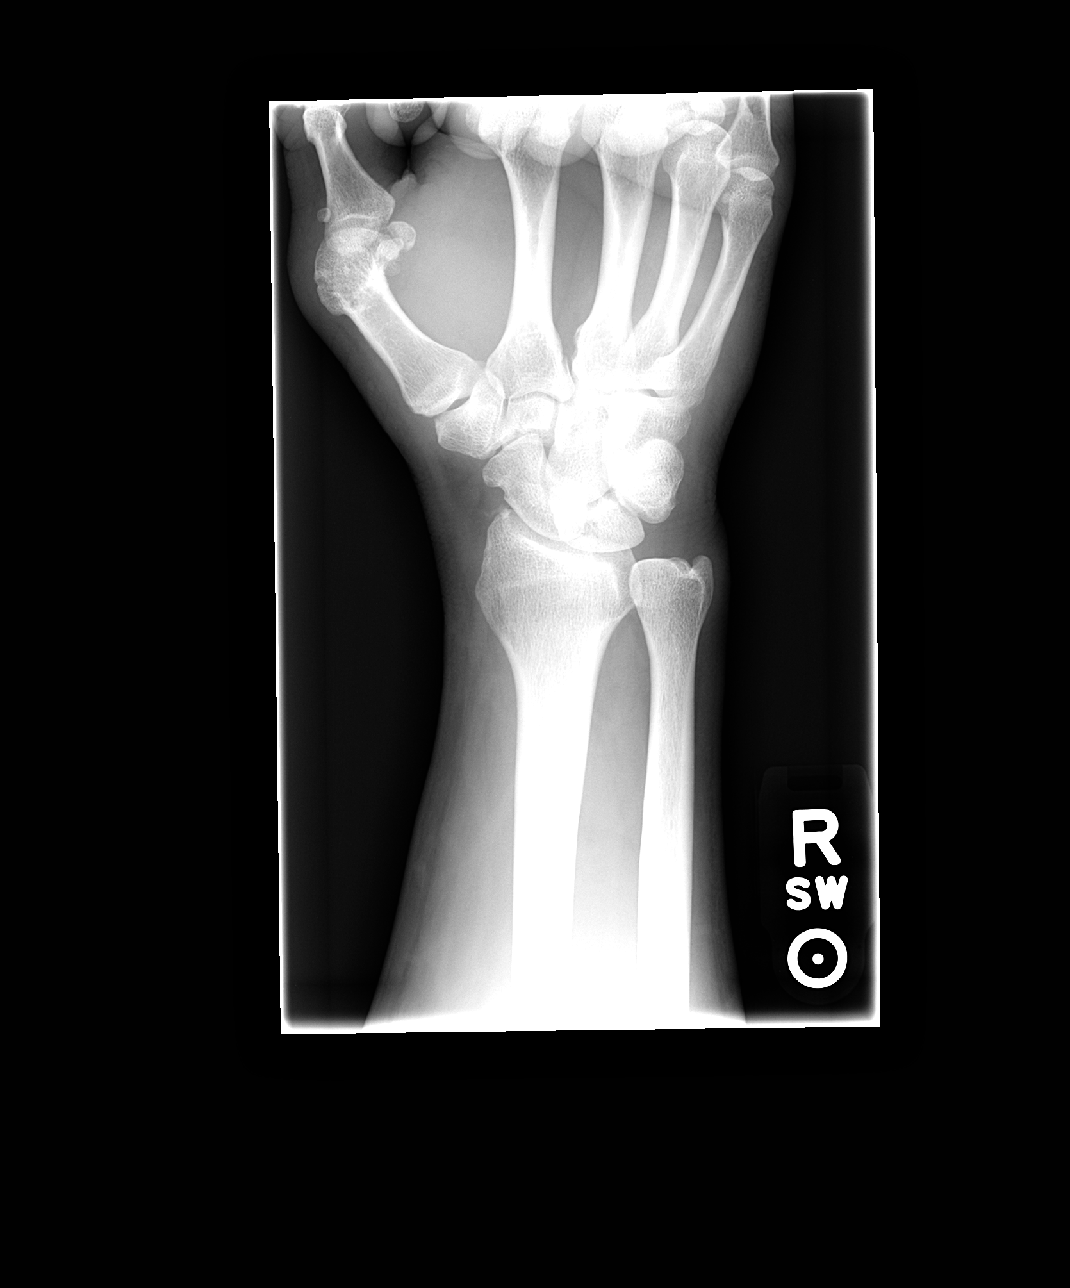

[view not recorded (3 of 4)]
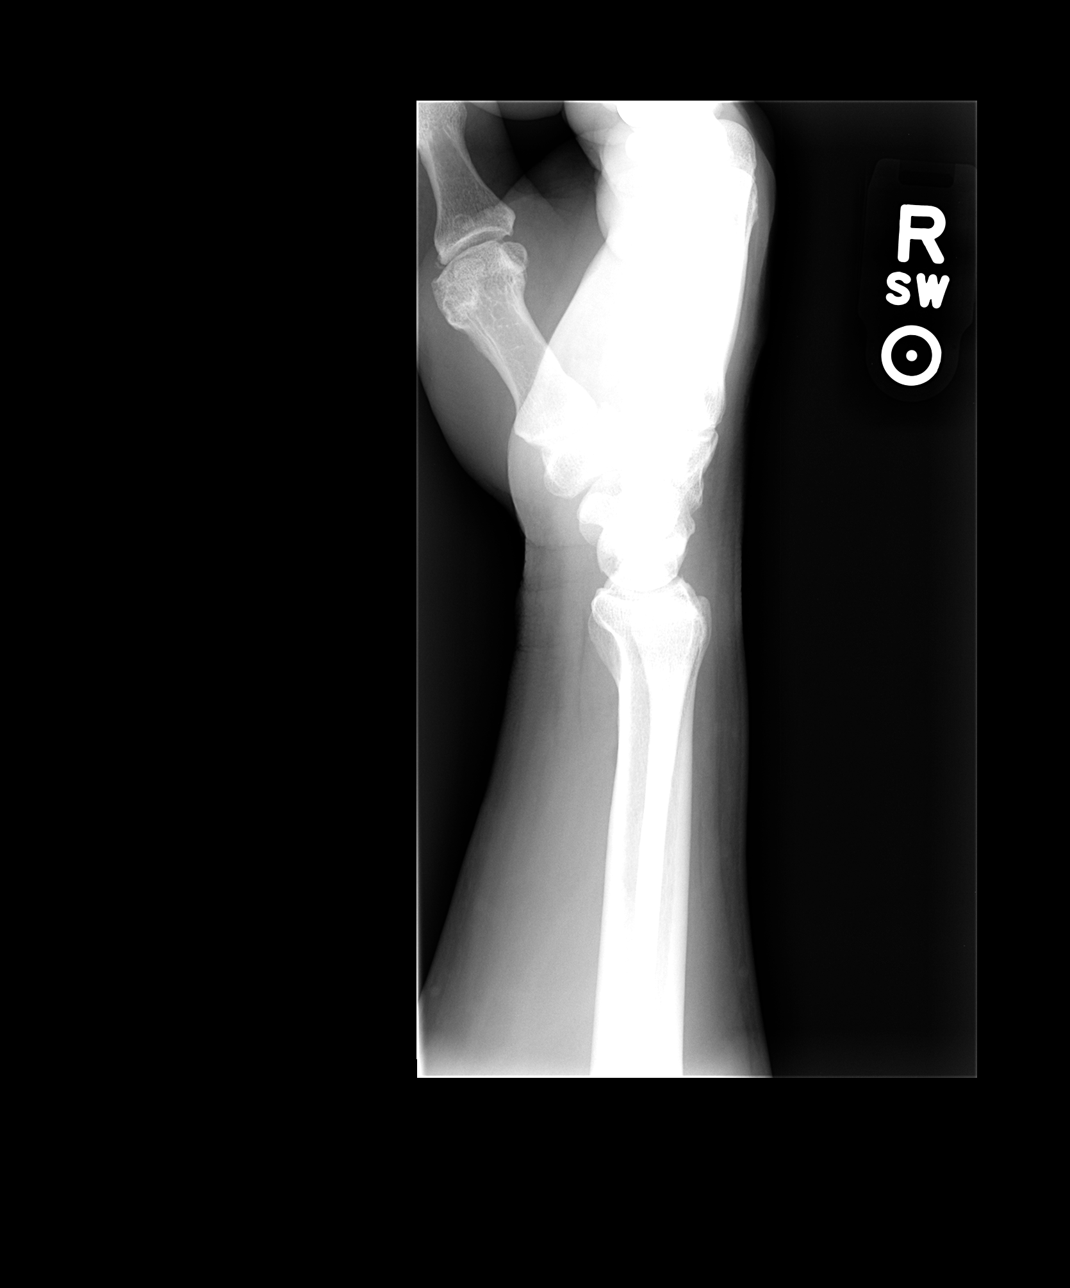

[view not recorded (4 of 4)]
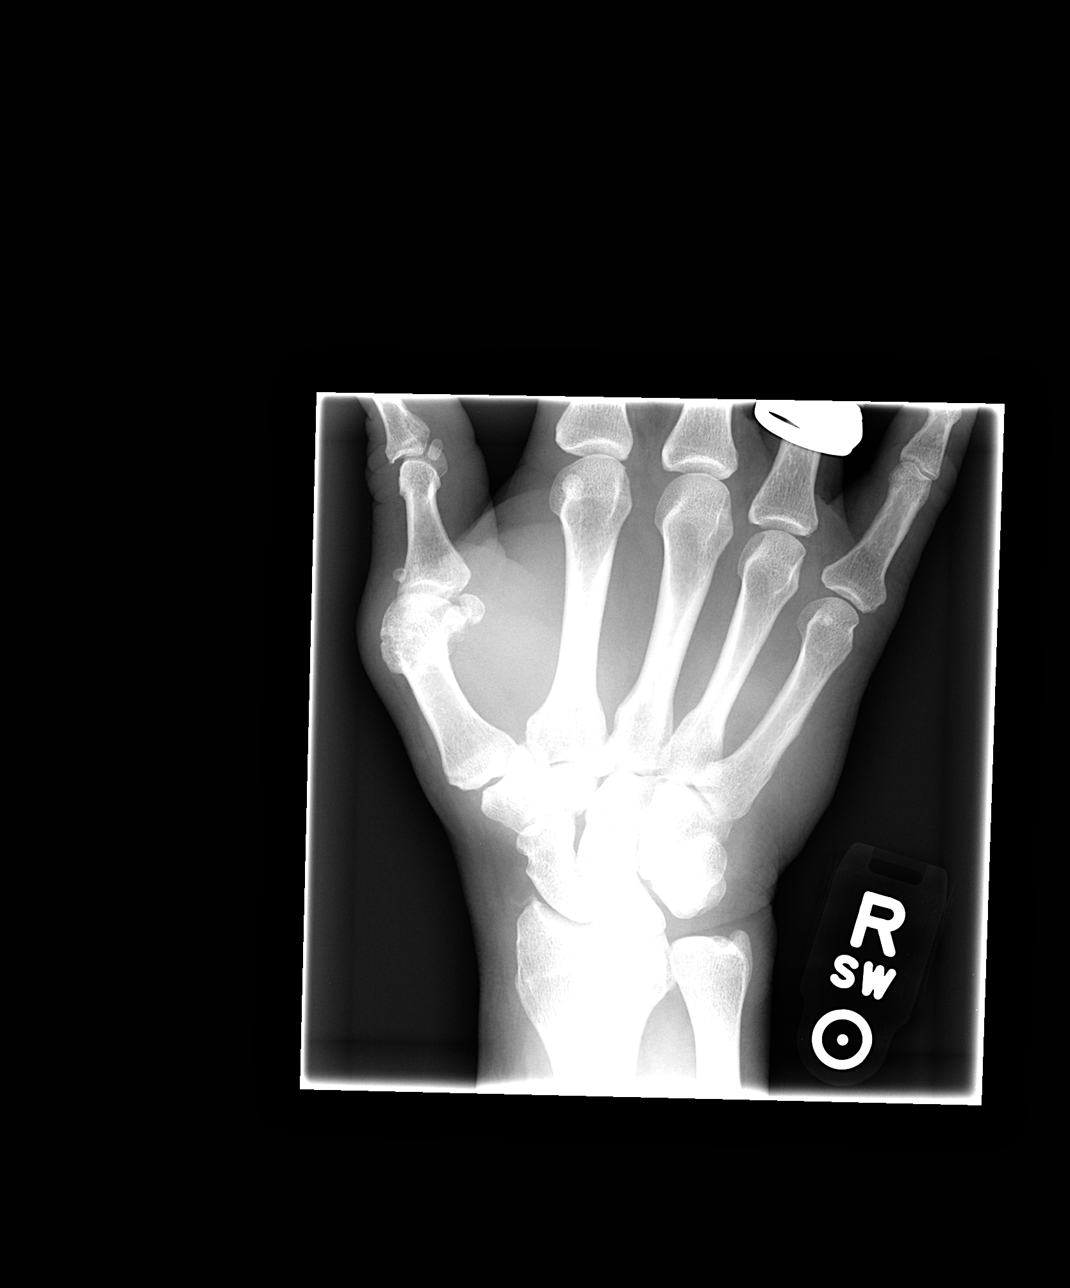

[4 of 4 positions shown; findings below may reference images not displayed]

FINDINGS: Mild soft tissue swelling noted about the wrist. Mild degenerative
change. Subtle fracture chip from the radial styloid cannot be
excluded. No other focal abnormality to
IMPRESSION: Subtle nondisplaced fracture chip of the radial styloid cannot be
excluded. No other acute bony abnormality identified.

## 2015-08-17 ENCOUNTER — Other Ambulatory Visit: Payer: Self-pay | Admitting: Sports Medicine

## 2015-08-20 ENCOUNTER — Other Ambulatory Visit: Payer: Self-pay

## 2015-08-20 MED ORDER — FEXOFENADINE HCL 180 MG PO TABS: 180.0000 mg | ORAL_TABLET | Freq: Every day | ORAL | Status: DC

## 2015-08-25 ENCOUNTER — Ambulatory Visit (INDEPENDENT_AMBULATORY_CARE_PROVIDER_SITE_OTHER): Payer: BLUE CROSS/BLUE SHIELD | Admitting: Sports Medicine

## 2015-08-25 ENCOUNTER — Encounter: Payer: Self-pay | Admitting: Sports Medicine

## 2015-08-25 VITALS — BP 145/101 | HR 83 | Resp 18 | Wt 229.0 lb

## 2015-08-25 DIAGNOSIS — I10 Essential (primary) hypertension: Secondary | ICD-10-CM

## 2015-08-25 DIAGNOSIS — J0100 Acute maxillary sinusitis, unspecified: Secondary | ICD-10-CM

## 2015-08-25 MED ORDER — AZITHROMYCIN 250 MG PO TABS: ORAL_TABLET | ORAL | Status: DC

## 2015-08-25 MED ORDER — ALLOPURINOL 300 MG PO TABS: 300.0000 mg | ORAL_TABLET | Freq: Every day | ORAL | Status: DC

## 2015-08-25 MED ORDER — FLUTICASONE PROPIONATE 50 MCG/ACT NA SUSP: NASAL | Status: DC

## 2015-08-25 MED ORDER — VALSARTAN 320 MG PO TABS: 320.0000 mg | ORAL_TABLET | Freq: Every day | ORAL | Status: DC

## 2015-08-25 NOTE — Assessment & Plan Note (Signed)
Azithromycin, Flonase 

## 2015-08-25 NOTE — Assessment & Plan Note (Signed)
Switching to valsartan max dose, continue amlodipine and metoprolol.

## 2015-08-25 NOTE — Progress Notes (Signed)
  Subjective:    CC: Sinus infection  HPI: This is a pleasant 55 year old male, for the past couple of weeks he's had increasing pain and pressure behind the maxillary sinuses with a runny nose, and sore throat, symptoms are moderate, persistent, no cough, shortness of breath, chest pain, no constitutional symptoms.  Hypertension: Still elevated on losartan, amlodipine, metoprolol. No headaches, visual changes, chest pain.  Past medical history, Surgical history, Family history not pertinant except as noted below, Social history, Allergies, and medications have been entered into the medical record, reviewed, and no changes needed.   Review of Systems: No fevers, chills, night sweats, weight loss, chest pain, or shortness of breath.   Objective:    General: Well Developed, well nourished, and in no acute distress.  Neuro: Alert and oriented x3, extra-ocular muscles intact, sensation grossly intact.  HEENT: Normocephalic, atraumatic, pupils equal round reactive to light, neck supple, no masses, no lymphadenopathy, thyroid nonpalpable. Oropharynx is somewhat erythematous, nasopharynx and ear canals are unremarkable, tender to palpation over the maxillary sinuses. Skin: Warm and dry, no rashes. Cardiac: Regular rate and rhythm, no murmurs rubs or gallops, no lower extremity edema.  Respiratory: Clear to auscultation bilaterally. Not using accessory muscles, speaking in full sentences.  Impression and Recommendations:

## 2015-09-01 ENCOUNTER — Telehealth: Payer: Self-pay

## 2015-09-01 MED ORDER — PREDNISONE 50 MG PO TABS: 50.0000 mg | ORAL_TABLET | Freq: Every day | ORAL | Status: DC

## 2015-09-01 NOTE — Telephone Encounter (Signed)
This indicates that his initial infection was likely viral which and will not be improved with antibiotics, he just needs to take its course. I will call in a 5 day burst of prednisone.

## 2015-09-01 NOTE — Telephone Encounter (Signed)
Patient advised.

## 2015-09-01 NOTE — Telephone Encounter (Signed)
Eduardo Mclean states he still feels bad. He complains of itchy eyes, runny nose and congestion. Please advise. Denies fever, chills or sweats.

## 2015-09-05 ENCOUNTER — Other Ambulatory Visit: Payer: Self-pay | Admitting: Sports Medicine

## 2015-09-05 MED ORDER — OMEPRAZOLE 20 MG PO CPDR
20.0000 mg | DELAYED_RELEASE_CAPSULE | Freq: Every day | ORAL | Status: DC
Start: 2015-09-05 — End: 2016-08-27

## 2015-10-07 ENCOUNTER — Other Ambulatory Visit: Payer: Self-pay | Admitting: Sports Medicine

## 2015-12-26 ENCOUNTER — Other Ambulatory Visit: Payer: Self-pay | Admitting: Sports Medicine

## 2016-01-06 ENCOUNTER — Other Ambulatory Visit: Payer: Self-pay | Admitting: Sports Medicine

## 2016-01-19 ENCOUNTER — Other Ambulatory Visit: Payer: Self-pay | Admitting: Sports Medicine

## 2016-01-22 ENCOUNTER — Ambulatory Visit (INDEPENDENT_AMBULATORY_CARE_PROVIDER_SITE_OTHER): Payer: BLUE CROSS/BLUE SHIELD

## 2016-01-22 ENCOUNTER — Encounter: Payer: Self-pay | Admitting: Sports Medicine

## 2016-01-22 ENCOUNTER — Ambulatory Visit (INDEPENDENT_AMBULATORY_CARE_PROVIDER_SITE_OTHER): Payer: BLUE CROSS/BLUE SHIELD | Admitting: Sports Medicine

## 2016-01-22 VITALS — BP 147/84 | HR 69 | Resp 18 | Wt 232.7 lb

## 2016-01-22 DIAGNOSIS — I10 Essential (primary) hypertension: Secondary | ICD-10-CM

## 2016-01-22 DIAGNOSIS — R05 Cough: Secondary | ICD-10-CM

## 2016-01-22 DIAGNOSIS — R059 Cough, unspecified: Secondary | ICD-10-CM

## 2016-01-22 MED ORDER — AMLODIPINE BESYLATE 10 MG PO TABS: 10.0000 mg | ORAL_TABLET | Freq: Every day | ORAL | Status: DC

## 2016-01-22 MED ORDER — PREDNISONE 50 MG PO TABS: 50.0000 mg | ORAL_TABLET | Freq: Every day | ORAL | Status: DC

## 2016-01-22 MED ORDER — AZITHROMYCIN 250 MG PO TABS: ORAL_TABLET | ORAL | Status: DC

## 2016-01-22 NOTE — Progress Notes (Signed)
  Subjective:    CC: Follow-up  HPI: This is a pleasant 55 year old male, he has been somewhat lost to follow-up.   Hypertension: Still elevated, tells me he's been out of his amlodipine for a week however I last gave him 30 pills in February of this year without refills.  Coughing: Present for a couple of weeks, minimally productive, no bleeding, no constitutional symptoms.   Past medical history, Surgical history, Family history not pertinant except as noted below, Social history, Allergies, and medications have been entered into the medical record, reviewed, and no changes needed.   Review of Systems: No fevers, chills, night sweats, weight loss, chest pain, or shortness of breath.   Objective:    General: Well Developed, well nourished, and in no acute distress.  Neuro: Alert and oriented x3, extra-ocular muscles intact, sensation grossly intact.  HEENT: Normocephalic, atraumatic, pupils equal round reactive to light, neck supple, no masses, no lymphadenopathy, thyroid nonpalpable.  Skin: Warm and dry, no rashes. Cardiac: Regular rate and rhythm, no murmurs rubs or gallops, no lower extremity edema.  Respiratory:  Coarse sounds in the bases bilaterally . Not using accessory muscles, speaking in full sentences.  Impression and Recommendations:   I spent 25 minutes with this patient, greater than 50% was face-to-face time counseling regarding the above diagnoses

## 2016-01-22 NOTE — Assessment & Plan Note (Signed)
Elevated but has been out of his amlodipine for one week. Needs to return in 2 weeks to recheck blood pressure.

## 2016-01-22 NOTE — Assessment & Plan Note (Signed)
With bibasilar crackles, chest x-ray, azithromycin, prednisone, return in 2 weeks.

## 2016-02-05 ENCOUNTER — Ambulatory Visit (INDEPENDENT_AMBULATORY_CARE_PROVIDER_SITE_OTHER): Payer: BLUE CROSS/BLUE SHIELD

## 2016-02-05 ENCOUNTER — Ambulatory Visit (INDEPENDENT_AMBULATORY_CARE_PROVIDER_SITE_OTHER): Payer: BLUE CROSS/BLUE SHIELD | Admitting: Sports Medicine

## 2016-02-05 DIAGNOSIS — I10 Essential (primary) hypertension: Secondary | ICD-10-CM | POA: Diagnosis not present

## 2016-02-05 DIAGNOSIS — R059 Cough, unspecified: Secondary | ICD-10-CM

## 2016-02-05 DIAGNOSIS — R05 Cough: Secondary | ICD-10-CM

## 2016-02-05 DIAGNOSIS — R1310 Dysphagia, unspecified: Secondary | ICD-10-CM | POA: Diagnosis not present

## 2016-02-05 LAB — COMPREHENSIVE METABOLIC PANEL WITH GFR
ALT: 39 U/L (ref 9–46)
AST: 39 U/L — ABNORMAL HIGH (ref 10–35)
Albumin: 4.4 g/dL (ref 3.6–5.1)
Alkaline Phosphatase: 63 U/L (ref 40–115)
BUN: 20 mg/dL (ref 7–25)
CO2: 24 mmol/L (ref 20–31)
Calcium: 9.5 mg/dL (ref 8.6–10.3)
Chloride: 103 mmol/L (ref 98–110)
Creat: 1.41 mg/dL — ABNORMAL HIGH (ref 0.70–1.33)
Glucose, Bld: 106 mg/dL — ABNORMAL HIGH (ref 65–99)
Potassium: 4.4 mmol/L (ref 3.5–5.3)
Sodium: 138 mmol/L (ref 135–146)
Total Bilirubin: 0.4 mg/dL (ref 0.2–1.2)
Total Protein: 7.1 g/dL (ref 6.1–8.1)

## 2016-02-05 LAB — CBC WITH DIFFERENTIAL/PLATELET
Basophils Absolute: 66 cells/uL (ref 0–200)
Basophils Relative: 1 %
Eosinophils Absolute: 198 {cells}/uL (ref 15–500)
Eosinophils Relative: 3 %
HCT: 44.7 % (ref 38.5–50.0)
Hemoglobin: 14.6 g/dL (ref 13.2–17.1)
Lymphocytes Relative: 32 %
Lymphs Abs: 2112 cells/uL (ref 850–3900)
MCH: 29.7 pg (ref 27.0–33.0)
MCHC: 32.7 g/dL (ref 32.0–36.0)
MCV: 91 fL (ref 80.0–100.0)
MPV: 11.2 fL (ref 7.5–12.5)
Monocytes Absolute: 528 {cells}/uL (ref 200–950)
Monocytes Relative: 8 %
Neutro Abs: 3696 {cells}/uL (ref 1500–7800)
Neutrophils Relative %: 56 %
Platelets: 172 K/uL (ref 140–400)
RBC: 4.91 MIL/uL (ref 4.20–5.80)
RDW: 14.8 % (ref 11.0–15.0)
WBC: 6.6 10*3/uL (ref 3.8–10.8)

## 2016-02-05 LAB — LIPID PANEL
Cholesterol: 177 mg/dL (ref 125–200)
HDL: 56 mg/dL
Total CHOL/HDL Ratio: 3.2 ratio
Triglycerides: 699 mg/dL — ABNORMAL HIGH

## 2016-02-05 LAB — TSH: TSH: 0.55 m[IU]/L (ref 0.40–4.50)

## 2016-02-05 MED ORDER — BENZONATATE 200 MG PO CAPS: 200.0000 mg | ORAL_CAPSULE | Freq: Three times a day (TID) | ORAL | 0 refills | Status: DC | PRN

## 2016-02-05 NOTE — Assessment & Plan Note (Signed)
Doing well with Prilosec did have a recurrence of symptoms when he self discontinued. Having a bit of diarrhea that coincides with use of azithromycin, if this continues we will proceed with C. difficile testing.

## 2016-02-05 NOTE — Assessment & Plan Note (Signed)
Doing better, checking a new chest x-ray and adding Tessalon Perles. We did go through a course of azithromycin and prednisone.

## 2016-02-05 NOTE — Progress Notes (Signed)
  Subjective:    CC: Follow-up  HPI: Hypertension: Well controlled  Bronchitis: Chest x-ray was negative initially, we treated him with azithromycin and prednisone, still having a persistent cough and really not getting much better.  Preventative measures: Due for some screening labs.  GERD: Went off of Prilosec for some time and immediately regretted it, since he's restarted he has developed some diarrhea but this is also around the same time frame that he did his azithromycin.  Past medical history, Surgical history, Family history not pertinant except as noted below, Social history, Allergies, and medications have been entered into the medical record, reviewed, and no changes needed.   Review of Systems: No fevers, chills, night sweats, weight loss, chest pain, or shortness of breath.   Objective:    General: Well Developed, well nourished, and in no acute distress.  Neuro: Alert and oriented x3, extra-ocular muscles intact, sensation grossly intact.  HEENT: Normocephalic, atraumatic, pupils equal round reactive to light, neck supple, no masses, no lymphadenopathy, thyroid nonpalpable.  Skin: Warm and dry, no rashes. Cardiac: Regular rate and rhythm, no murmurs rubs or gallops, no lower extremity edema.  Respiratory: Clear to auscultation bilaterally. Not using accessory muscles, speaking in full sentences.  Impression and Recommendations:    Hypertension  Well-controlled, checking routine blood work.  Dysphagia Doing well with Prilosec did have a recurrence of symptoms when he self discontinued. Having a bit of diarrhea that coincides with use of azithromycin, if this continues we will proceed with C. difficile testing.  Coughing Doing better, checking a new chest x-ray and adding Tessalon Perles. We did go through a course of azithromycin and prednisone.

## 2016-02-05 NOTE — Assessment & Plan Note (Signed)
Well-controlled, checking routine blood work.

## 2016-02-06 ENCOUNTER — Encounter: Payer: Self-pay | Admitting: Sports Medicine

## 2016-02-06 DIAGNOSIS — E119 Type 2 diabetes mellitus without complications: Secondary | ICD-10-CM | POA: Insufficient documentation

## 2016-02-06 LAB — HEMOGLOBIN A1C
Hgb A1c MFr Bld: 6.7 % — ABNORMAL HIGH (ref <5.7)
Mean Plasma Glucose: 146 mg/dL

## 2016-02-06 LAB — HEPATITIS C ANTIBODY: HCV Ab: NEGATIVE

## 2016-02-06 LAB — HIV ANTIBODY (ROUTINE TESTING W REFLEX): HIV 1&2 Ab, 4th Generation: NONREACTIVE

## 2016-02-20 ENCOUNTER — Ambulatory Visit (INDEPENDENT_AMBULATORY_CARE_PROVIDER_SITE_OTHER): Payer: BLUE CROSS/BLUE SHIELD | Admitting: Sports Medicine

## 2016-02-20 ENCOUNTER — Encounter: Payer: Self-pay | Admitting: Sports Medicine

## 2016-02-20 DIAGNOSIS — E781 Pure hyperglyceridemia: Secondary | ICD-10-CM

## 2016-02-20 DIAGNOSIS — E119 Type 2 diabetes mellitus without complications: Secondary | ICD-10-CM

## 2016-02-20 MED ORDER — DAPAGLIFLOZIN PRO-METFORMIN ER 10-1000 MG PO TB24: 1.0000 | ORAL_TABLET | Freq: Every day | ORAL | 11 refills | Status: DC

## 2016-02-20 MED ORDER — FENOFIBRATE 160 MG PO TABS: 160.0000 mg | ORAL_TABLET | Freq: Every day | ORAL | 3 refills | Status: DC

## 2016-02-20 NOTE — Progress Notes (Signed)
  Subjective:    CC: Follow-up blood work  HPI: Hypertension: Well controlled  Diabetes mellitus type 2: New diagnosis, tells me he has been very lackadaisical with his diet.  Hypertriglyceridemia: Minimally elevated in the recent past 2 years ago, now is in the 600s. Agreeable to go on medication.  Past medical history, Surgical history, Family history not pertinant except as noted below, Social history, Allergies, and medications have been entered into the medical record, reviewed, and no changes needed.   Review of Systems: No fevers, chills, night sweats, weight loss, chest pain, or shortness of breath.   Objective:    General: Well Developed, well nourished, and in no acute distress.  Neuro: Alert and oriented x3, extra-ocular muscles intact, sensation grossly intact.  HEENT: Normocephalic, atraumatic, pupils equal round reactive to light, neck supple, no masses, no lymphadenopathy, thyroid nonpalpable.  Skin: Warm and dry, no rashes. Cardiac: Regular rate and rhythm, no murmurs rubs or gallops, no lower extremity edema.  Respiratory: Clear to auscultation bilaterally. Not using accessory muscles, speaking in full sentences.  Impression and Recommendations:    Hypertriglyceridemia Starting fenofibrate, recheck in 3 months.  Diabetes mellitus type 2, controlled, without complications (HCC) Starting XigDuo, recheck A1c in 3 months. We will catch him up on his other diabetic screening tests at that time.  I spent 25 minutes with this patient, greater than 50% was face-to-face time counseling regarding the above diagnoses

## 2016-02-20 NOTE — Assessment & Plan Note (Signed)
Starting fenofibrate, recheck in 3 months.

## 2016-02-20 NOTE — Assessment & Plan Note (Signed)
Starting XigDuo, recheck A1c in 3 months. We will catch him up on his other diabetic screening tests at that time.

## 2016-02-20 NOTE — Patient Instructions (Signed)
Food Choices to Lower Your Triglycerides Triglycerides are a type of fat in your blood. High levels of triglycerides can increase the risk of heart disease and stroke. If your triglyceride levels are high, the foods you eat and your eating habits are very important. Choosing the right foods can help lower your triglycerides.  WHAT GENERAL GUIDELINES DO I NEED TO FOLLOW?  Lose weight if you are overweight.   Limit or avoid alcohol.   Fill one half of your plate with vegetables and green salads.   Limit fruit to two servings a day. Choose fruit instead of juice.   Make one fourth of your plate whole grains. Look for the word "whole" as the first word in the ingredient list.  Fill one fourth of your plate with lean protein foods.  Enjoy fatty fish (such as salmon, mackerel, sardines, and tuna) three times a week.   Choose healthy fats.   Limit foods high in starch and sugar.  Eat more home-cooked food and less restaurant, buffet, and fast food.  Limit fried foods.  Cook foods using methods other than frying.  Limit saturated fats.  Check ingredient lists to avoid foods with partially hydrogenated oils (trans fats) in them. WHAT FOODS CAN I EAT?  Grains Whole grains, such as whole wheat or whole grain breads, crackers, cereals, and pasta. Unsweetened oatmeal, bulgur, barley, quinoa, or brown rice. Corn or whole wheat flour tortillas.  Vegetables Fresh or frozen vegetables (raw, steamed, roasted, or grilled). Green salads. Fruits All fresh, canned (in natural juice), or frozen fruits. Meat and Other Protein Products Ground beef (85% or leaner), grass-fed beef, or beef trimmed of fat. Skinless chicken or turkey. Ground chicken or turkey. Pork trimmed of fat. All fish and seafood. Eggs. Dried beans, peas, or lentils. Unsalted nuts or seeds. Unsalted canned or dry beans. Dairy Low-fat dairy products, such as skim or 1% milk, 2% or reduced-fat cheeses, low-fat ricotta or cottage  cheese, or plain low-fat yogurt. Fats and Oils Tub margarines without trans fats. Light or reduced-fat mayonnaise and salad dressings. Avocado. Safflower, olive, or canola oils. Natural peanut or almond butter. The items listed above may not be a complete list of recommended foods or beverages. Contact your dietitian for more options. WHAT FOODS ARE NOT RECOMMENDED?  Grains White bread. White pasta. White rice. Cornbread. Bagels, pastries, and croissants. Crackers that contain trans fat. Vegetables White potatoes. Corn. Creamed or fried vegetables. Vegetables in a cheese sauce. Fruits Dried fruits. Canned fruit in light or heavy syrup. Fruit juice. Meat and Other Protein Products Fatty cuts of meat. Ribs, chicken wings, bacon, sausage, bologna, salami, chitterlings, fatback, hot dogs, bratwurst, and packaged luncheon meats. Dairy Whole or 2% milk, cream, half-and-half, and cream cheese. Whole-fat or sweetened yogurt. Full-fat cheeses. Nondairy creamers and whipped toppings. Processed cheese, cheese spreads, or cheese curds. Sweets and Desserts Corn syrup, sugars, honey, and molasses. Candy. Jam and jelly. Syrup. Sweetened cereals. Cookies, pies, cakes, donuts, muffins, and ice cream. Fats and Oils Butter, stick margarine, lard, shortening, ghee, or bacon fat. Coconut, palm kernel, or palm oils. Beverages Alcohol. Sweetened drinks (such as sodas, lemonade, and fruit drinks or punches). The items listed above may not be a complete list of foods and beverages to avoid. Contact your dietitian for more information.   This information is not intended to replace advice given to you by your health care provider. Make sure you discuss any questions you have with your health care provider.   Document Released: 04/08/2004   Document Revised: 07/12/2014 Document Reviewed: 04/25/2013 Elsevier Interactive Patient Education 2016 Elsevier Inc. Diabetes Mellitus and Food It is important for you to manage  your blood sugar (glucose) level. Your blood glucose level can be greatly affected by what you eat. Eating healthier foods in the appropriate amounts throughout the day at about the same time each day will help you control your blood glucose level. It can also help slow or prevent worsening of your diabetes mellitus. Healthy eating may even help you improve the level of your blood pressure and reach or maintain a healthy weight.  General recommendations for healthful eating and cooking habits include:  Eating meals and snacks regularly. Avoid going long periods of time without eating to lose weight.  Eating a diet that consists mainly of plant-based foods, such as fruits, vegetables, nuts, legumes, and whole grains.  Using low-heat cooking methods, such as baking, instead of high-heat cooking methods, such as deep frying. Work with your dietitian to make sure you understand how to use the Nutrition Facts information on food labels. HOW CAN FOOD AFFECT ME? Carbohydrates Carbohydrates affect your blood glucose level more than any other type of food. Your dietitian will help you determine how many carbohydrates to eat at each meal and teach you how to count carbohydrates. Counting carbohydrates is important to keep your blood glucose at a healthy level, especially if you are using insulin or taking certain medicines for diabetes mellitus. Alcohol Alcohol can cause sudden decreases in blood glucose (hypoglycemia), especially if you use insulin or take certain medicines for diabetes mellitus. Hypoglycemia can be a life-threatening condition. Symptoms of hypoglycemia (sleepiness, dizziness, and disorientation) are similar to symptoms of having too much alcohol.  If your health care provider has given you approval to drink alcohol, do so in moderation and use the following guidelines:  Women should not have more than one drink per day, and men should not have more than two drinks per day. One drink is equal  to:  12 oz of beer.  5 oz of wine.  1 oz of hard liquor.  Do not drink on an empty stomach.  Keep yourself hydrated. Have water, diet soda, or unsweetened iced tea.  Regular soda, juice, and other mixers might contain a lot of carbohydrates and should be counted. WHAT FOODS ARE NOT RECOMMENDED? As you make food choices, it is important to remember that all foods are not the same. Some foods have fewer nutrients per serving than other foods, even though they might have the same number of calories or carbohydrates. It is difficult to get your body what it needs when you eat foods with fewer nutrients. Examples of foods that you should avoid that are high in calories and carbohydrates but low in nutrients include:  Trans fats (most processed foods list trans fats on the Nutrition Facts label).  Regular soda.  Juice.  Candy.  Sweets, such as cake, pie, doughnuts, and cookies.  Fried foods. WHAT FOODS CAN I EAT? Eat nutrient-rich foods, which will nourish your body and keep you healthy. The food you should eat also will depend on several factors, including:  The calories you need.  The medicines you take.  Your weight.  Your blood glucose level.  Your blood pressure level.  Your cholesterol level. You should eat a variety of foods, including:  Protein.  Lean cuts of meat.  Proteins low in saturated fats, such as fish, egg whites, and beans. Avoid processed meats.  Fruits and vegetables.  Fruits   and vegetables that may help control blood glucose levels, such as apples, mangoes, and yams.  Dairy products.  Choose fat-free or low-fat dairy products, such as milk, yogurt, and cheese.  Grains, bread, pasta, and rice.  Choose whole grain products, such as multigrain bread, whole oats, and brown rice. These foods may help control blood pressure.  Fats.  Foods containing healthful fats, such as nuts, avocado, olive oil, canola oil, and fish. DOES EVERYONE WITH  DIABETES MELLITUS HAVE THE SAME MEAL PLAN? Because every person with diabetes mellitus is different, there is not one meal plan that works for everyone. It is very important that you meet with a dietitian who will help you create a meal plan that is just right for you.   This information is not intended to replace advice given to you by your health care provider. Make sure you discuss any questions you have with your health care provider.   Document Released: 03/18/2005 Document Revised: 07/12/2014 Document Reviewed: 05/18/2013 Elsevier Interactive Patient Education [employer]2016 Elsevier Inc.

## 2016-02-23 ENCOUNTER — Telehealth: Payer: Self-pay | Admitting: *Deleted

## 2016-02-23 NOTE — Telephone Encounter (Signed)
PA initiated through the [employer] hotline   They will not call us back unless they are unable to process the PA request. They notifiy the patient.

## 2016-02-25 ENCOUNTER — Telehealth: Payer: Self-pay | Admitting: *Deleted

## 2016-02-25 NOTE — Telephone Encounter (Signed)
Received a denial from the insurance company for [place] [facility]. Called the patient and the patient did state that he did fill the prescription but with the coupon card he still had to pay $53.79. Called Delaney Meigsamara (xigduo rep) since the max amount that the coupon card used to cover is now a lesser amount there is a percentage of patients that will have an out of pocket expense of on average of about $40. So really it will vary and the patient may or may not have a 0 dollar copay.  The patient states he will not be able to continue to pay the $53 each month and wants to know what other medication can be used in place of xigduo. Please advise.

## 2016-02-26 MED ORDER — CANAGLIFLOZIN-METFORMIN HCL ER 150-1000 MG PO TB24: 2.0000 | ORAL_TABLET | Freq: Every day | ORAL | 11 refills | Status: DC

## 2016-02-26 NOTE — Telephone Encounter (Signed)
Yes, lets switch to Invokamet XR, patient can pick up prescription and discount coupon, it's in my box

## 2016-02-26 NOTE — Telephone Encounter (Signed)
Patient notifed and rx and savings card up front

## 2016-04-13 ENCOUNTER — Other Ambulatory Visit: Payer: Self-pay | Admitting: Sports Medicine

## 2016-04-13 DIAGNOSIS — I10 Essential (primary) hypertension: Secondary | ICD-10-CM

## 2016-04-20 ENCOUNTER — Other Ambulatory Visit: Payer: Self-pay | Admitting: *Deleted

## 2016-04-20 DIAGNOSIS — I10 Essential (primary) hypertension: Secondary | ICD-10-CM

## 2016-04-20 MED ORDER — AMLODIPINE BESYLATE 10 MG PO TABS: 10.0000 mg | ORAL_TABLET | Freq: Every day | ORAL | 0 refills | Status: DC

## 2016-04-20 NOTE — Progress Notes (Signed)
Received request for  90 day supply of amlodipine to be sent to [facility] instead of 30 day supply. Refill sent.

## 2016-05-17 ENCOUNTER — Other Ambulatory Visit: Payer: Self-pay | Admitting: Sports Medicine

## 2016-05-21 ENCOUNTER — Ambulatory Visit: Payer: BLUE CROSS/BLUE SHIELD | Admitting: Sports Medicine

## 2016-06-07 ENCOUNTER — Other Ambulatory Visit: Payer: Self-pay | Admitting: Sports Medicine

## 2016-06-27 ENCOUNTER — Other Ambulatory Visit: Payer: Self-pay | Admitting: Sports Medicine

## 2016-06-27 DIAGNOSIS — I10 Essential (primary) hypertension: Secondary | ICD-10-CM

## 2016-07-23 ENCOUNTER — Emergency Department
Admission: EM | Admit: 2016-07-23 | Discharge: 2016-07-23 | Disposition: A | Discharge status: Home / Self Care | Payer: BLUE CROSS/BLUE SHIELD | Source: Home / Self Care | Attending: Family Medicine | Admitting: Family Medicine

## 2016-07-23 ENCOUNTER — Emergency Department (INDEPENDENT_AMBULATORY_CARE_PROVIDER_SITE_OTHER): Payer: BLUE CROSS/BLUE SHIELD

## 2016-07-23 ENCOUNTER — Encounter: Payer: Self-pay | Admitting: Emergency Medicine

## 2016-07-23 DIAGNOSIS — W009XXA Unspecified fall due to ice and snow, initial encounter: Secondary | ICD-10-CM | POA: Diagnosis not present

## 2016-07-23 DIAGNOSIS — S76111A Strain of right quadriceps muscle, fascia and tendon, initial encounter: Secondary | ICD-10-CM

## 2016-07-23 DIAGNOSIS — S79921A Unspecified injury of right thigh, initial encounter: Secondary | ICD-10-CM | POA: Diagnosis not present

## 2016-07-23 MED ORDER — HYDROCODONE-ACETAMINOPHEN 5-325 MG PO TABS: 1.0000 | ORAL_TABLET | Freq: Four times a day (QID) | ORAL | 0 refills | Status: DC | PRN

## 2016-07-23 MED ORDER — CYCLOBENZAPRINE HCL 10 MG PO TABS: 10.0000 mg | ORAL_TABLET | Freq: Every day | ORAL | 0 refills | Status: DC

## 2016-07-23 MED ORDER — KETOROLAC TROMETHAMINE 60 MG/2ML IJ SOLN
60.0000 mg | Freq: Once | INTRAMUSCULAR | Status: AC
  Administered 2016-07-23: 60 mg via INTRAMUSCULAR

## 2016-07-23 NOTE — Discharge Instructions (Signed)
Wear ace wrap until swelling resolves.  Apply ice pack for 20 to 30 minutes, 3 to 4 times daily  Continue until pain and swelling decrease.  Begin range of motion and stretching exercises as tolerated. 

## 2016-07-23 NOTE — ED Provider Notes (Signed)
[facility]    CSN: [id number] Arrival date & time: 07/23/16  1540     History   Chief Complaint Chief Complaint  Patient presents with  . Leg Pain    HPI Eduardo Mclean is a 56 y.o. male.   Patient fell on ice yesterday, injuring his right thigh.  He states that he did a "split," recovering himself and did not fall to the ground.  He reports that he injured his right thigh about a month ago when he slipped down stairs.  His leg had almost healed until yesterday.   The history is provided by the patient.  Leg Pain  Location:  Leg Time since incident:  1 day Leg location:  R upper leg Pain details:    Quality:  Aching   Radiates to:  Does not radiate   Severity:  Moderate   Onset quality:  Sudden   Timing:  Constant   Progression:  Unchanged Chronicity:  Recurrent Dislocation: no   Foreign body present:  No foreign bodies Prior injury to area:  Yes Relieved by:  None tried Worsened by:  Bearing weight, flexion and extension Ineffective treatments:  Ice Associated symptoms: decreased ROM, muscle weakness, stiffness and swelling   Associated symptoms: no back pain, no fatigue, no numbness and no tingling     Past Medical History:  Diagnosis Date  . GERD (gastroesophageal reflux disease)   . Gout   . Hypertension   . Seasonal allergies     Patient Active Problem List   Diagnosis Date Noted  . Hypertriglyceridemia 02/20/2016  . Diabetes mellitus type 2, controlled, without complications (HCC) 02/06/2016  . Low back pain 07/16/2013  . 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 Erectile dysfunction 07/16/2013  . Preventive measure 06/18/2013  . Gout 03/09/2012  . Hypertension 03/09/2012    Past Surgical History:  Procedure Laterality Date  . BACK SURGERY         Home Medications    Prior to Admission medications   Medication Sig Start Date End Date Taking? Authorizing Provider  allopurinol (ZYLOPRIM) 300 MG tablet Take 1 tablet (300 mg total) by  mouth daily. 08/25/15   Monica Bectonhomas J Thekkekandam, MD  amLODipine (NORVASC) 10 MG tablet Take 1 tablet (10 mg total) by mouth daily. 04/20/16   Monica Bectonhomas J Thekkekandam, MD  benzonatate (TESSALON) 200 MG capsule Take 1 capsule (200 mg total) by mouth 3 (three) times daily as needed for cough. 02/05/16   Monica Bectonhomas J Thekkekandam, MD  Canagliflozin-Metformin HCl ER (INVOKAMET XR) [phone] MG TB24 Take 2 tablets by mouth daily. 02/26/16   Monica Bectonhomas J Thekkekandam, MD  cyclobenzaprine (FLEXERIL) 10 MG tablet Take 1 tablet (10 mg total) by mouth at bedtime. 07/23/16   Lattie HawStephen A Beese, MD  fenofibrate 160 MG tablet Take 1 tablet (160 mg total) by mouth daily. 02/20/16   Monica Bectonhomas J Thekkekandam, MD  fexofenadine (ALLEGRA) 180 MG tablet Take 1 tablet (180 mg total) by mouth daily. 08/20/15   Monica Bectonhomas J Thekkekandam, MD  fluticasone (FLONASE) 50 MCG/ACT nasal spray One spray in each nostril twice a day, use left hand for right nostril, and right hand for left nostril. 08/25/15   Monica Bectonhomas J Thekkekandam, MD  HYDROcodone-acetaminophen (NORCO/VICODIN) 5-325 MG tablet Take 1 tablet by mouth every 6 (six) hours as needed for moderate pain. Take one by mouth at bedtime as needed for pain 07/23/16   Lattie HawStephen A Beese, MD  LEVITRA 20 MG tablet TAKE ONE TABLET BY MOUTH DAILY AS NEEDED 12/26/15  Monica Becton, MD  metoprolol (LOPRESSOR) 50 MG tablet TAKE ONE TABLET BY MOUTH TWICE DAILY 06/07/16   Monica Becton, MD  omeprazole (PRILOSEC) 20 MG capsule Take 1 capsule (20 mg total) by mouth daily. 09/05/15   Monica Becton, MD  valsartan (DIOVAN) 320 MG tablet TAKE 1 TABLET (320 MG TOTAL) BY MOUTH DAILY. 06/27/16   Monica Becton, MD    Family History Family History  Problem Relation Age of Onset  . Hypertension Father     Social History Social History  Substance Use Topics  . Smoking status: Current Some Day Smoker  . Smokeless tobacco: Never Used  . Alcohol use Yes     Allergies   Patient has no known  allergies.   Review of Systems Review of Systems  Constitutional: Negative for fatigue.  Musculoskeletal: Positive for stiffness. Negative for back pain.  All other systems reviewed and are negative.    Physical Exam Triage Vital Signs ED Triage Vitals  Enc Vitals Group     BP 07/23/16 1609 169/98     Pulse Rate 07/23/16 1609 (!) 58     Resp --      Temp 07/23/16 1609 97.6 F (36.4 C)     Temp Source 07/23/16 1609 Oral     SpO2 07/23/16 1609 96 %     Weight 07/23/16 1610 220 lb (99.8 kg)     Height --      Head Circumference --      Peak Flow --      Pain Score 07/23/16 1611 9     Pain Loc --      Pain Edu? --      Excl. in GC? --    No data found.   Updated Vital Signs BP 169/98 (BP Location: Right Arm)   Pulse (!) 58   Temp 97.6 F (36.4 C) (Oral)   Wt 220 lb (99.8 kg)   SpO2 96%   BMI 34.46 kg/m   Visual Acuity Right Eye Distance:   Left Eye Distance:   Bilateral Distance:    Right Eye Near:   Left Eye Near:    Bilateral Near:     Physical Exam  Constitutional: He appears well-developed and well-nourished. No distress.  HENT:  Head: Atraumatic.  Nose: Nose normal.  Mouth/Throat: Oropharynx is clear and moist.  Eyes: Conjunctivae are normal. Pupils are equal, round, and reactive to light.  Neck: Normal range of motion.  Cardiovascular: Normal heart sounds.   Pulmonary/Chest: Breath sounds normal.  Abdominal: There is no tenderness.  Musculoskeletal:       Right lower leg: He exhibits tenderness and swelling.       Legs: RIght anterior thigh has distinct tenderness to palpation, but minimal swelling and no ecchymosis.  He has pain with knee flexion, and pain with resisted right hip flexion.  Distal neurovascular function is intact.   Neurological: He is alert.  Skin: Skin is warm and dry.  Nursing note and vitals reviewed.    UC Treatments / Results  Labs (all labs ordered are listed, but only abnormal results are displayed) Labs Reviewed  - No data to display  EKG  EKG Interpretation None       Radiology Dg Femur Min 2 Views Right  Result Date: 07/23/2016 CLINICAL DATA:  Pt states he slipped on ice yesterday and injured his right femur. States pain is distal, anterior and on the lateral side. Pain increases when bearing weight. EXAM: RIGHT FEMUR  2 VIEWS COMPARISON:  None. FINDINGS: AP and lateral views of the right femur are provided. Osseous alignment is normal. Bone mineralization is normal. No fracture line or displaced fracture fragment identified. Mild degenerative spurring noted at the right hip joint. Soft tissues about the right femur are unremarkable. IMPRESSION: No acute findings.  No osseous fracture or dislocation Electronically Signed   By: Bary Richard M.D.   On: 07/23/2016 17:15    Procedures Procedures (including critical care time)  Medications Ordered in UC Medications  ketorolac (TORADOL) injection 60 mg (60 mg Intramuscular Given 07/23/16 1654)     Initial Impression / Assessment and Plan / UC Course  I have reviewed the triage vital signs and the nursing notes.  Pertinent labs & imaging results that were available during my care of the patient were reviewed by me and considered in my medical decision making (see chart for details).  Administered Toradol 60mg  IM  Rx for Lortab, and Flexeril at bedtime. Wear ace wrap until swelling resolves.  Apply ice pack for 20 to 30 minutes, 3 to 4 times daily  Continue until pain and swelling decrease.  Begin range of motion and stretching exercises as tolerated. Followup with Dr. Rodney Langton or Dr. Clementeen [facility] ([facility]) if not improving about two weeks.      Final Clinical Impressions(s) / UC Diagnoses   Final diagnoses:  Quadriceps strain, right, initial encounter    New Prescriptions New Prescriptions   CYCLOBENZAPRINE (FLEXERIL) 10 MG TABLET    Take 1 tablet (10 mg total) by mouth at bedtime.   HYDROCODONE-ACETAMINOPHEN  (NORCO/VICODIN) 5-325 MG TABLET    Take 1 tablet by mouth every 6 (six) hours as needed for moderate pain. Take one by mouth at bedtime as needed for pain     Lattie Haw, MD 07/31/16 [phone]

## 2016-07-23 NOTE — ED Triage Notes (Signed)
Pt c/o right leg pain around thigh area. Started after falling on ice yesterday. Has not taken any meds for this.

## 2016-08-19 ENCOUNTER — Other Ambulatory Visit: Payer: Self-pay | Admitting: Sports Medicine

## 2016-08-27 ENCOUNTER — Other Ambulatory Visit: Payer: Self-pay | Admitting: Sports Medicine

## 2016-08-28 ENCOUNTER — Other Ambulatory Visit: Payer: Self-pay | Admitting: Sports Medicine

## 2016-08-28 DIAGNOSIS — I10 Essential (primary) hypertension: Secondary | ICD-10-CM

## 2016-10-15 ENCOUNTER — Other Ambulatory Visit: Payer: Self-pay | Admitting: Sports Medicine

## 2016-10-19 ENCOUNTER — Other Ambulatory Visit: Payer: Self-pay | Admitting: Sports Medicine

## 2016-10-19 DIAGNOSIS — I10 Essential (primary) hypertension: Secondary | ICD-10-CM

## 2016-12-03 ENCOUNTER — Other Ambulatory Visit: Payer: Self-pay

## 2016-12-03 DIAGNOSIS — I10 Essential (primary) hypertension: Secondary | ICD-10-CM

## 2016-12-03 MED ORDER — VALSARTAN 320 MG PO TABS: 320.0000 mg | ORAL_TABLET | Freq: Every day | ORAL | 0 refills | Status: DC

## 2016-12-08 ENCOUNTER — Other Ambulatory Visit: Payer: Self-pay | Admitting: Sports Medicine

## 2016-12-31 ENCOUNTER — Other Ambulatory Visit: Payer: Self-pay | Admitting: Sports Medicine

## 2016-12-31 MED ORDER — SILDENAFIL CITRATE 20 MG PO TABS: 20.0000 mg | ORAL_TABLET | ORAL | 11 refills | Status: AC | PRN

## 2017-02-14 ENCOUNTER — Other Ambulatory Visit: Payer: Self-pay | Admitting: Sports Medicine

## 2017-02-14 MED ORDER — IRBESARTAN 300 MG PO TABS: 300.0000 mg | ORAL_TABLET | Freq: Every day | ORAL | 3 refills | Status: DC

## 2017-03-29 ENCOUNTER — Other Ambulatory Visit: Payer: Self-pay | Admitting: Sports Medicine

## 2017-05-13 ENCOUNTER — Other Ambulatory Visit: Payer: Self-pay | Admitting: Sports Medicine

## 2017-05-13 DIAGNOSIS — I10 Essential (primary) hypertension: Secondary | ICD-10-CM

## 2017-05-24 IMAGING — DX DG CHEST 2V
2 series · 2 of 2 positions shown · non-contrast
Comparison: PA and lateral chest x-ray January 22, 2016

CLINICAL DATA: Persistent cough, current smoker.

EXAM:
CHEST  2 VIEW

[chest pa]
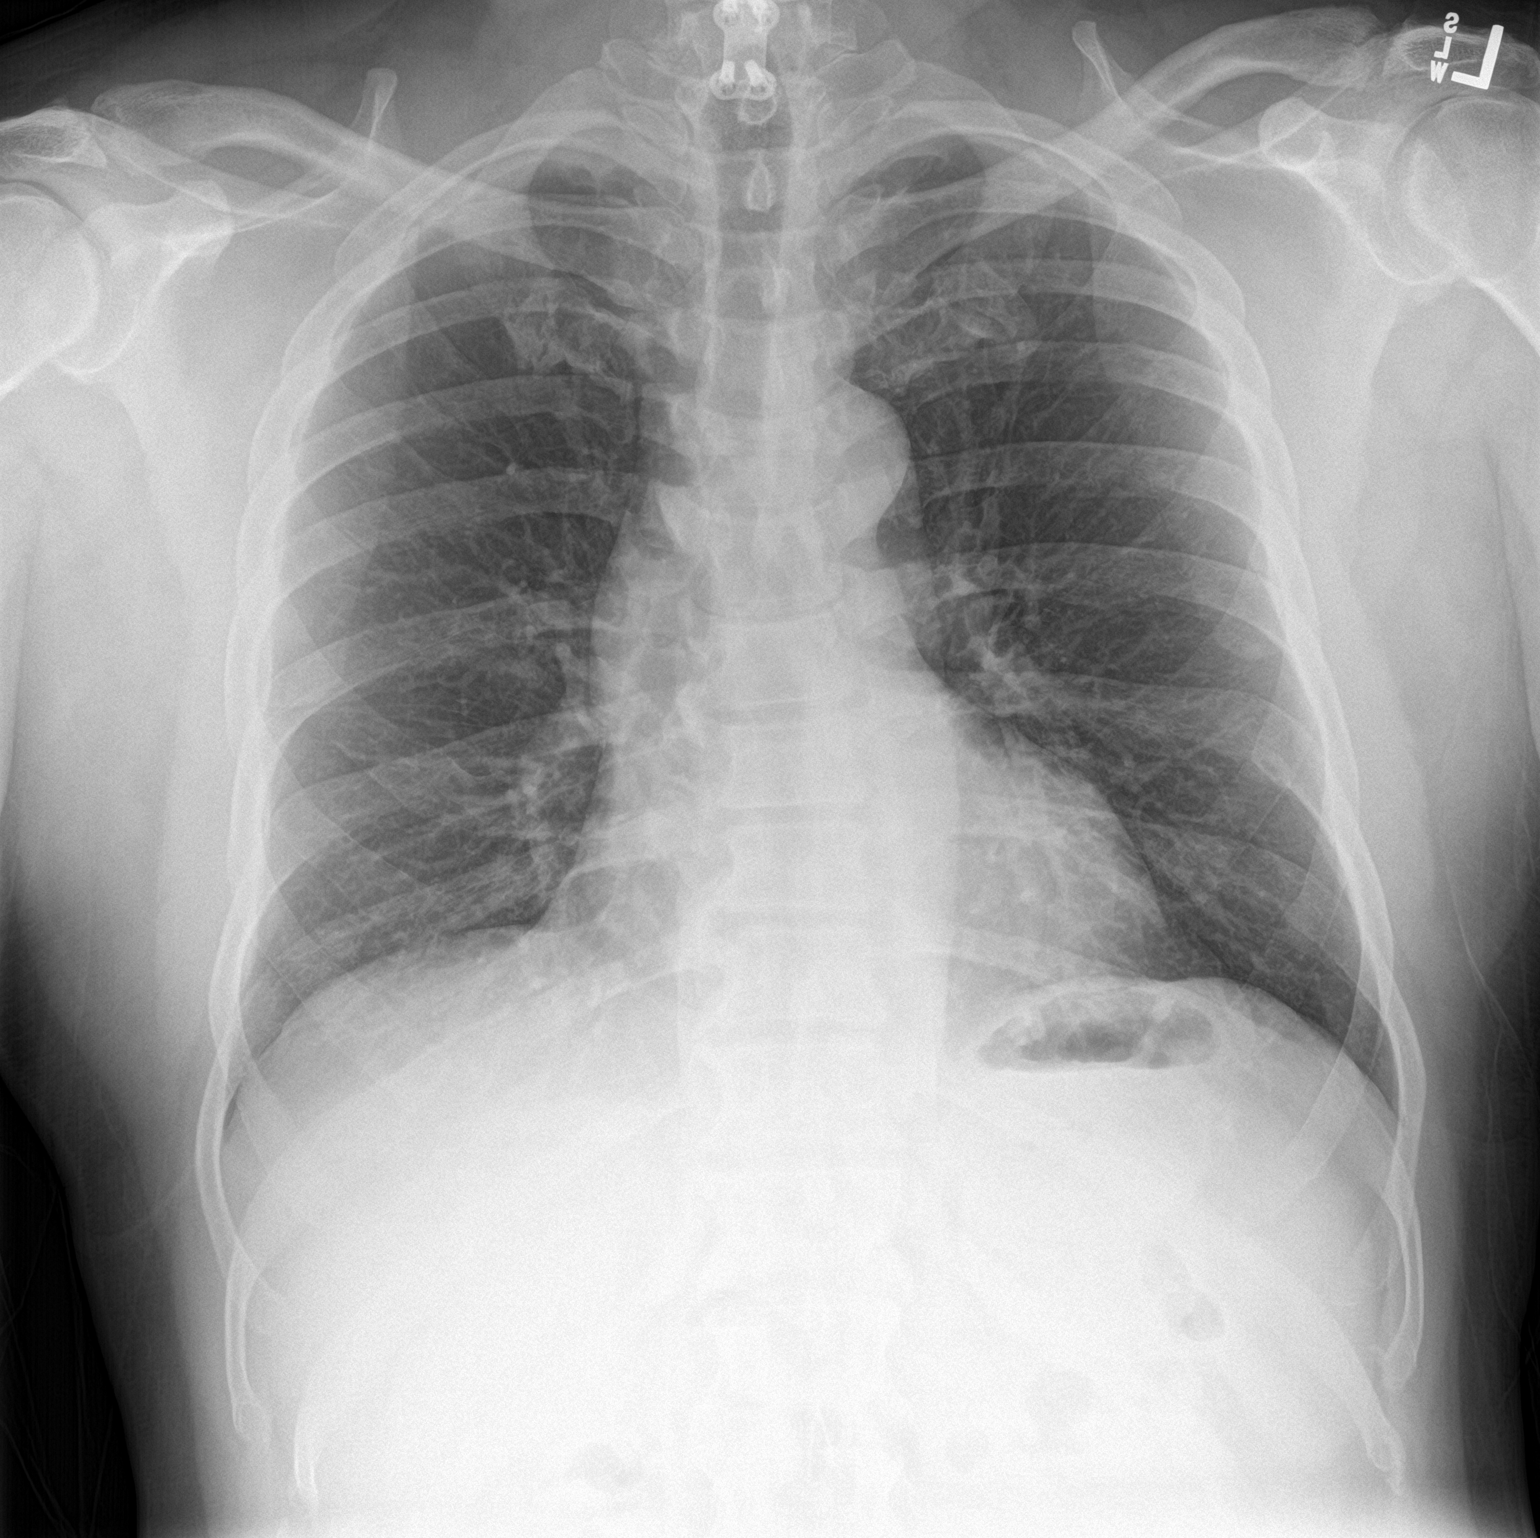

[chest lat]
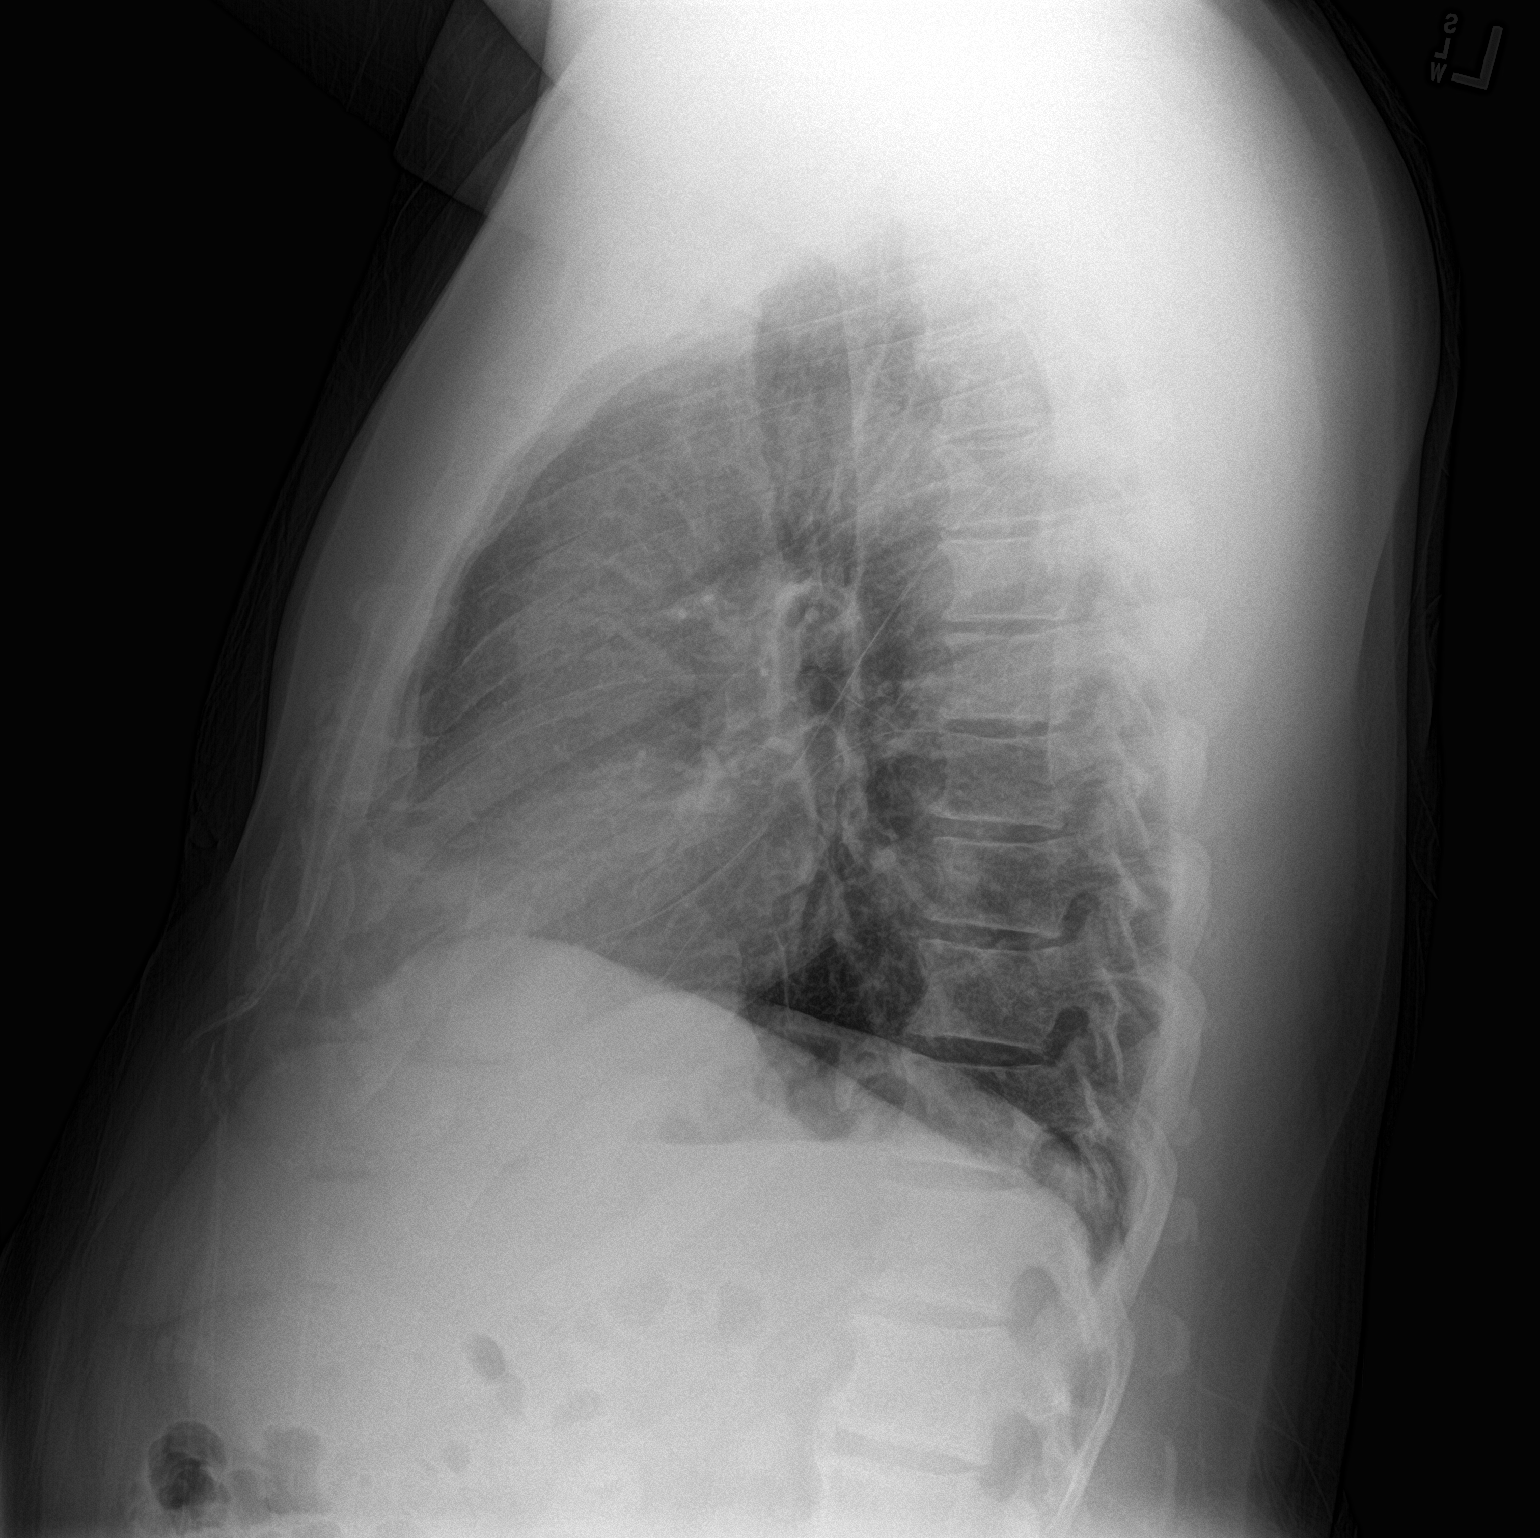

[2 of 2 positions shown; findings below may reference images not displayed]

FINDINGS: The lungs are adequately inflated. A interstitial markings are
chronically increased. There is no alveolar infiltrate. There is no
pleural effusion. The heart and pulmonary vascularity are normal.
The mediastinum is normal in width. The bony thorax exhibits no
acute abnormality. The patient has undergone previous lower anterior
cervical fusion.
IMPRESSION: Chronic bronchitic changes. No acute cardiopulmonary abnormality. If
the patient's cough persists and remains unexplained, chest CT
scanning would be a useful next imaging step.

## 2017-05-25 ENCOUNTER — Other Ambulatory Visit: Payer: Self-pay | Admitting: Sports Medicine

## 2017-05-25 DIAGNOSIS — J0100 Acute maxillary sinusitis, unspecified: Secondary | ICD-10-CM

## 2017-06-15 ENCOUNTER — Other Ambulatory Visit: Payer: Self-pay | Admitting: Sports Medicine

## 2017-07-14 ENCOUNTER — Telehealth: Payer: Self-pay | Admitting: Sports Medicine

## 2017-07-14 MED ORDER — VALSARTAN 320 MG PO TABS: 320.0000 mg | ORAL_TABLET | Freq: Every day | ORAL | 3 refills | Status: DC

## 2017-07-14 NOTE — Telephone Encounter (Signed)
Irbesartan is currently on back order, sending valsartan is replacement for the time being.

## 2017-07-25 ENCOUNTER — Other Ambulatory Visit: Payer: Self-pay | Admitting: Sports Medicine

## 2017-09-04 ENCOUNTER — Other Ambulatory Visit: Payer: Self-pay | Admitting: Sports Medicine

## 2017-10-07 ENCOUNTER — Other Ambulatory Visit: Payer: Self-pay | Admitting: Sports Medicine

## 2017-11-26 ENCOUNTER — Other Ambulatory Visit: Payer: Self-pay | Admitting: Sports Medicine

## 2017-11-28 ENCOUNTER — Telehealth: Payer: Self-pay

## 2017-11-28 ENCOUNTER — Emergency Department
Admission: EM | Admit: 2017-11-28 | Discharge: 2017-11-28 | Disposition: A | Discharge status: Home / Self Care | Payer: BLUE CROSS/BLUE SHIELD | Source: Home / Self Care | Attending: Family Medicine | Admitting: Family Medicine

## 2017-11-28 ENCOUNTER — Other Ambulatory Visit: Payer: Self-pay

## 2017-11-28 DIAGNOSIS — R519 Headache, unspecified: Secondary | ICD-10-CM

## 2017-11-28 DIAGNOSIS — R51 Headache: Secondary | ICD-10-CM

## 2017-11-28 DIAGNOSIS — I10 Essential (primary) hypertension: Secondary | ICD-10-CM | POA: Diagnosis not present

## 2017-11-28 LAB — POCT CBC W AUTO DIFF (K'VILLE URGENT CARE)

## 2017-11-28 LAB — POCT URINALYSIS DIP (MANUAL ENTRY)
Bilirubin, UA: NEGATIVE
Blood, UA: NEGATIVE
Glucose, UA: NEGATIVE mg/dL
Ketones, POC UA: NEGATIVE mg/dL
Nitrite, UA: NEGATIVE
Protein Ur, POC: NEGATIVE mg/dL
Spec Grav, UA: 1.02 (ref 1.010–1.025)
Urobilinogen, UA: 0.2 E.U./dL
pH, UA: 6 (ref 5.0–8.0)

## 2017-11-28 MED ORDER — ACETAMINOPHEN 325 MG PO TABS
975.0000 mg | ORAL_TABLET | Freq: Once | ORAL | Status: AC
  Administered 2017-11-28: 975 mg via ORAL

## 2017-11-28 MED ORDER — IBUPROFEN 800 MG PO TABS: 800.0000 mg | ORAL_TABLET | Freq: Three times a day (TID) | ORAL | 0 refills | Status: DC

## 2017-11-28 NOTE — ED Triage Notes (Signed)
Pt stated that Saturday evening started having a headache.  Sunday it was pounding, and BP was 168/103.  Took ibuprofen, and nothing helped.  Any time he lies down, the pain becomes intense and pounding.  This am BP 173/109.

## 2017-11-28 NOTE — Discharge Instructions (Signed)
°  You may take  acetaminophen every 4-6 hours or in combination with ibuprofen 400-800mg  every 6-8 hours as needed for pain.   Be sure to drink at least eight 8oz glasses of water to stay well hydrated and get at least 8 hours of sleep at night, preferably more while sick.   Please follow up with your family doctor for monitoring of your blood pressure and if headaches keep returning.  Please call 911 or go to closest emergency department if you develop worsening headache, change in vision, vomiting, chest pain, trouble breathing, one sided weakness/numbness or other new concerning symptoms develop.

## 2017-11-28 NOTE — ED Provider Notes (Signed)
[facility]    CSN: [id number] Arrival date & time: 11/28/17  1017     History   Chief Complaint Chief Complaint  Patient presents with  . Headache  . Hypertension    HPI Eduardo Mclean is a 57 y.o. male.   HPI Eduardo Mclean is a 57 y.o. male presenting to UC with c/o gradual onset, gradually worsening HA that started 2 days ago.  Yesterday HA became severe.  He has taken ibuprofen with minimal relief.  HA feels like it is pounding, 8/10.  Pain is worse when lying down.  He does have a hx of HTN but notes it is typically well controlled with his BP medication, which he has taken as prescribed. This morning he took his BP and it was 173/109.  He does report being out in the heat on Saturday morning for a funeral but he has been trying to eat and drink normal amounts.  Denies chest pain, SOB, dizziness, change in vision, nausea or vomiting.  No prior hx of migraines.   Past Medical History:  Diagnosis Date  . GERD (gastroesophageal reflux disease)   . Gout   . Hypertension   . Seasonal allergies     Patient Active Problem List   Diagnosis Date Noted  . Hypertriglyceridemia 02/20/2016  . Diabetes mellitus type 2, controlled, without complications (HCC) 02/06/2016  . Low back pain 07/16/2013  . 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 Erectile dysfunction 07/16/2013  . Preventive measure 06/18/2013  . Gout 03/09/2012  . Hypertension 03/09/2012    Past Surgical History:  Procedure Laterality Date  . BACK SURGERY         Home Medications    Prior to Admission medications   Medication Sig Start Date End Date Taking? Authorizing Provider  allopurinol (ZYLOPRIM) 300 MG tablet TAKE 1 TABLET (300 MG TOTAL) BY MOUTH DAILY. 09/05/17   Monica Becton, MD  amLODipine (NORVASC) 10 MG tablet TAKE 1 TABLET (10 MG TOTAL) BY MOUTH DAILY. 05/13/17   Monica Becton, MD  benzonatate (TESSALON) 200 MG capsule Take 1 capsule (200 mg total) by mouth 3 (three) times  daily as needed for cough. 02/05/16   Monica Becton, MD  Canagliflozin-Metformin HCl ER (INVOKAMET XR) [phone] MG TB24 Take 2 tablets by mouth daily. 02/26/16   Monica Becton, MD  cyclobenzaprine (FLEXERIL) 10 MG tablet Take 1 tablet (10 mg total) by mouth at bedtime. 07/23/16   Lattie Haw, MD  fenofibrate 160 MG tablet Take 1 tablet (160 mg total) by mouth daily. 02/20/16   Monica Becton, MD  fexofenadine (ALLEGRA) 180 MG tablet TAKE 1 TABLET (180 MG TOTAL) BY MOUTH DAILY. 10/15/16   Monica Becton, MD  fluticasone (FLONASE) 50 MCG/ACT nasal spray INSTILL 1 SPRAY INTO EACH NOSTRIL TWICE A DAY,USE LEFT HAND FOR RIGHT NOSTRIL, RIGHT HAND FOR LEFT 05/25/17   Monica Becton, MD  HYDROcodone-acetaminophen (NORCO/VICODIN) 5-325 MG tablet Take 1 tablet by mouth every 6 (six) hours as needed for moderate pain. Take one by mouth at bedtime as needed for pain 07/23/16   Lattie Haw, MD  ibuprofen (ADVIL,MOTRIN) 800 MG tablet Take 1 tablet (800 mg total) by mouth 3 (three) times daily. 11/28/17   Lurene Shadow, PA-C  irbesartan (AVAPRO) 300 MG tablet TAKE 1 TABLET BY MOUTH EVERY DAY 10/07/17   Monica Becton, MD  metoprolol tartrate (LOPRESSOR) 50 MG tablet TAKE ONE TABLET BY MOUTH TWICE DAILY 11/26/17   Rodney Langton  J, MD  omeprazole (PRILOSEC) 20 MG capsule TAKE 1 CAPSULE (20 MG TOTAL) BY MOUTH DAILY. 08/27/16   Monica Becton, MD  sildenafil (REVATIO) 20 MG tablet Take 1-5 tablets (20-100 mg total) by mouth as needed. 12/31/16   Monica Becton, MD  valsartan (DIOVAN) 320 MG tablet Take 1 tablet (320 mg total) by mouth daily. 07/14/17   Monica Becton, MD    Family History Family History  Problem Relation Age of Onset  . Hypertension Father     Social History Social History   Tobacco Use  . Smoking status: Current Some Day Smoker  . Smokeless tobacco: Never Used  Substance Use Topics  . Alcohol use: Yes  . Drug  use: No     Allergies   Patient has no known allergies.   Review of Systems Review of Systems  Constitutional: Negative for chills, diaphoresis, fatigue and fever.  HENT: Negative for congestion, ear pain, sinus pressure, sinus pain and sore throat.   Eyes: Positive for photophobia. Negative for pain and visual disturbance.  Cardiovascular: Negative for chest pain, palpitations and leg swelling.  Gastrointestinal: Negative for nausea and vomiting.  Musculoskeletal: Negative for myalgias, neck pain and neck stiffness.  Skin: Negative for rash.  Neurological: Positive for headaches. Negative for dizziness, tremors, seizures, syncope, facial asymmetry, speech difficulty, weakness, light-headedness and numbness.     Physical Exam Triage Vital Signs ED Triage Vitals  Enc Vitals Group     BP 11/28/17 1043 (!) 176/113     Pulse Rate 11/28/17 1043 66     Resp --      Temp 11/28/17 1043 97.7 F (36.5 C)     Temp Source 11/28/17 1043 Oral     SpO2 11/28/17 1043 96 %     Weight 11/28/17 1044 240 lb (108.9 kg)     Height 11/28/17 1044  (1.702 m)     Head Circumference --      Peak Flow --      Pain Score 11/28/17 1043 6     Pain Loc --      Pain Edu? --      Excl. in GC? --    No data found.  Updated Vital Signs BP (!) 166/110 (BP Location: Left Arm)   Pulse 66   Temp 97.7 F (36.5 C) (Oral)   Ht  (1.702 m)   Wt 240 lb (108.9 kg)   SpO2 96%   BMI 37.59 kg/m   Visual Acuity Right Eye Distance:   Left Eye Distance:   Bilateral Distance:    Right Eye Near:   Left Eye Near:    Bilateral Near:     Physical Exam  Constitutional: He is oriented to person, place, and time. He appears well-developed and well-nourished.  Non-toxic appearance. He does not appear ill. No distress.  HENT:  Head: Normocephalic and atraumatic.  Right Ear: Tympanic membrane normal.  Left Ear: Tympanic membrane normal.  Nose: Nose normal. Right sinus exhibits no maxillary sinus  tenderness and no frontal sinus tenderness. Left sinus exhibits no maxillary sinus tenderness and no frontal sinus tenderness.  Mouth/Throat: Uvula is midline, oropharynx is clear and moist and mucous membranes are normal.  Eyes: Pupils are equal, round, and reactive to light. EOM are normal.  Neck: Normal range of motion. Neck supple.  No nuchal rigidity or meningeal signs.  Cardiovascular: Normal rate and regular rhythm.  Pulmonary/Chest: Effort normal and breath sounds normal. No stridor. No respiratory distress.  He has no wheezes.  Musculoskeletal: Normal range of motion. He exhibits no edema.  Neurological: He is alert and oriented to person, place, and time. He has normal strength. He displays a negative Romberg sign. Coordination and gait normal. GCS eye subscore is 4. GCS verbal subscore is 5. GCS motor subscore is 6.  Skin: Skin is warm and dry.  Psychiatric: He has a normal mood and affect. His behavior is normal.  Nursing note and vitals reviewed.    UC Treatments / Results  Labs (all labs ordered are listed, but only abnormal results are displayed) Labs Reviewed  POCT URINALYSIS DIP (MANUAL ENTRY) - Abnormal; Notable for the following components:      Result Value   Leukocytes, UA Trace (*)    All other components within normal limits  COMPLETE METABOLIC PANEL WITH GFR  C-REACTIVE PROTEIN  SEDIMENTATION RATE  POCT CBC W AUTO DIFF ([facility])    EKG None  Radiology No results found.  Procedures Procedures (including critical care time)  Medications Ordered in UC Medications  acetaminophen (TYLENOL) tablet 975 mg (975 mg Oral Given 11/28/17 1103)    Initial Impression / Assessment and Plan / UC Course  I have reviewed the triage vital signs and the nursing notes.  Pertinent labs & imaging results that were available during my care of the patient were reviewed by me and considered in my medical decision making (see chart for details).     HA  gradually improving with acetaminophen, rest, and water given in UC. BP did improve some but then increased again to 166/110.  No worsening or new symptoms.  Pt states he feels comfortable being discharged home. No evidence of stroke. Doubt SAH. Encouraged f/u with PCP this week. Discussed symptoms that warrant emergent care in the ED.   Final Clinical Impressions(s) / UC Diagnoses   Final diagnoses:  Bad headache  Essential hypertension     Discharge Instructions      You may take  acetaminophen every 4-6 hours or in combination with ibuprofen 400-800mg  every 6-8 hours as needed for pain.   Be sure to drink at least eight 8oz glasses of water to stay well hydrated and get at least 8 hours of sleep at night, preferably more while sick.   Please follow up with your family doctor for monitoring of your blood pressure and if headaches keep returning.  Please call 911 or go to closest emergency department if you develop worsening headache, change in vision, vomiting, chest pain, trouble breathing, one sided weakness/numbness or other new concerning symptoms develop.     ED Prescriptions    Medication Sig Dispense Auth. Provider   ibuprofen (ADVIL,MOTRIN) 800 MG tablet Take 1 tablet (800 mg total) by mouth 3 (three) times daily. 21 tablet Lurene Shadow, PA-C     Controlled Substance Prescriptions [place] Controlled Substance Registry consulted? Not Applicable   Lurene Shadow, PA-C 11/28/17 1309

## 2017-11-28 NOTE — Telephone Encounter (Signed)
Pt stated he is still having a slight headache, but has been asleep since getting home from UC.

## 2017-11-30 ENCOUNTER — Telehealth: Payer: Self-pay | Admitting: *Deleted

## 2017-11-30 LAB — COMPLETE METABOLIC PANEL WITH GFR
AG Ratio: 1.8 (calc) (ref 1.0–2.5)
ALT: 23 U/L (ref 9–46)
AST: 21 U/L (ref 10–35)
Albumin: 4.2 g/dL (ref 3.6–5.1)
Alkaline phosphatase (APISO): 59 U/L (ref 40–115)
BUN: 14 mg/dL (ref 7–25)
CO2: 26 mmol/L (ref 20–32)
Calcium: 9.4 mg/dL (ref 8.6–10.3)
Chloride: 105 mmol/L (ref 98–110)
Creat: 1 mg/dL (ref 0.70–1.33)
GFR, Est African American: 98 mL/min/{1.73_m2} (ref >=60)
GFR, Est Non African American: 84 mL/min/{1.73_m2} (ref >=60)
Globulin: 2.3 g/dL (calc) (ref 1.9–3.7)
Glucose, Bld: 109 mg/dL — ABNORMAL HIGH (ref 65–99)
Potassium: 4.3 mmol/L (ref 3.5–5.3)
Sodium: 139 mmol/L (ref 135–146)
Total Bilirubin: 0.5 mg/dL (ref 0.2–1.2)
Total Protein: 6.5 g/dL (ref 6.1–8.1)

## 2017-11-30 LAB — C-REACTIVE PROTEIN: CRP: 15.7 mg/L — ABNORMAL HIGH (ref <8.0)

## 2017-11-30 LAB — SEDIMENTATION RATE: Sed Rate: 2 mm/h (ref 0–20)

## 2017-11-30 NOTE — Telephone Encounter (Signed)
Spoke to pt given lab results. He reports his HA is better and his BP has still been elevated, but much better than at his visit. He will call back for prednisone rx if HA returns.

## 2018-03-01 ENCOUNTER — Other Ambulatory Visit: Payer: Self-pay | Admitting: Sports Medicine

## 2018-03-14 ENCOUNTER — Other Ambulatory Visit: Payer: Self-pay | Admitting: Sports Medicine

## 2018-03-14 DIAGNOSIS — I10 Essential (primary) hypertension: Secondary | ICD-10-CM

## 2018-04-16 ENCOUNTER — Other Ambulatory Visit: Payer: Self-pay | Admitting: Sports Medicine

## 2018-05-25 ENCOUNTER — Other Ambulatory Visit: Payer: Self-pay | Admitting: Sports Medicine

## 2018-06-06 ENCOUNTER — Other Ambulatory Visit: Payer: Self-pay | Admitting: Sports Medicine

## 2018-06-06 DIAGNOSIS — I10 Essential (primary) hypertension: Secondary | ICD-10-CM

## 2018-06-06 MED ORDER — VALSARTAN 320 MG PO TABS: 320.0000 mg | ORAL_TABLET | Freq: Every day | ORAL | 3 refills | Status: DC

## 2018-06-06 NOTE — Assessment & Plan Note (Signed)
Irbesartan on backorder, switching to valsartan 320.

## 2018-08-25 ENCOUNTER — Other Ambulatory Visit: Payer: Self-pay | Admitting: Sports Medicine

## 2018-09-20 ENCOUNTER — Other Ambulatory Visit: Payer: Self-pay | Admitting: Sports Medicine

## 2018-09-20 DIAGNOSIS — I10 Essential (primary) hypertension: Secondary | ICD-10-CM

## 2018-09-23 ENCOUNTER — Other Ambulatory Visit: Payer: Self-pay | Admitting: Sports Medicine

## 2018-10-21 ENCOUNTER — Other Ambulatory Visit: Payer: Self-pay | Admitting: Sports Medicine

## 2018-10-21 DIAGNOSIS — I10 Essential (primary) hypertension: Secondary | ICD-10-CM

## 2018-10-23 NOTE — Telephone Encounter (Signed)
No visit since 2017, if he wants to continue care here he will need to have an in person visit.

## 2018-10-23 NOTE — Telephone Encounter (Signed)
Called Pt, we are no longer PCP. Rx refused. Dr T removed from care team.

## 2018-11-09 ENCOUNTER — Other Ambulatory Visit: Payer: Self-pay | Admitting: Sports Medicine

## 2018-12-25 ENCOUNTER — Other Ambulatory Visit: Payer: Self-pay | Admitting: Sports Medicine

## 2020-08-24 ENCOUNTER — Emergency Department
Admission: EM | Admit: 2020-08-24 | Discharge: 2020-08-24 | Disposition: A | Discharge status: Home / Self Care | Payer: BLUE CROSS/BLUE SHIELD | Source: Home / Self Care

## 2020-08-24 ENCOUNTER — Other Ambulatory Visit: Payer: Self-pay

## 2020-08-24 ENCOUNTER — Encounter: Payer: Self-pay | Admitting: Emergency Medicine

## 2020-08-24 DIAGNOSIS — L249 Irritant contact dermatitis, unspecified cause: Secondary | ICD-10-CM

## 2020-08-24 DIAGNOSIS — I10 Essential (primary) hypertension: Secondary | ICD-10-CM

## 2020-08-24 MED ORDER — METHYLPREDNISOLONE SODIUM SUCC 125 MG IJ SOLR
125.0000 mg | Freq: Once | INTRAMUSCULAR | Status: AC
  Administered 2020-08-24: 125 mg via INTRAMUSCULAR

## 2020-08-24 MED ORDER — TRIAMCINOLONE ACETONIDE 0.025 % EX OINT: 1.0000 "application " | TOPICAL_OINTMENT | Freq: Two times a day (BID) | CUTANEOUS | 0 refills | Status: AC | PRN

## 2020-08-24 NOTE — ED Triage Notes (Signed)
Patient c/o rash on both wrists since Wednesday.  Area is red and very itchy.  Patient did try on some coats in a thrifty store.  No new detergents or soaps, applied Cortisone 10 and Afterbite ointment.

## 2020-08-24 NOTE — Discharge Instructions (Addendum)
Monitor your blood pressure at home to ensure that medication is responding effectively.  If your blood pressure remains greater than 160/100 follow-up immediately with your primary care doctor for further management of your medications.  For your contact dermatitis you received a steroid injection while you were here at urgent care this will clear the skin reaction mostly therefore you only need the use of triamcinolone cream if you are itching and to help rash resolve quicker.

## 2020-08-24 NOTE — ED Provider Notes (Signed)
[facility]    CSN: [id number] Arrival date & time: 08/24/20  1354      History   Chief Complaint Chief Complaint  Patient presents with  . Rash    HPI [facility]ardo [facility] is a 60 y.o. male.   HPI  Patient presents today for evaluation of a rash involving both wrists which is pruritic, erythematous, after patient was fitting clothing prior to purchase at a thrift shop. He denies any changes to detergents or soaps.  Patient did apply cortisone ointment to rash without any improvement.  The rash does not involve any other portion of his body.  He denies a history of eczema or recurrent contact dermatitis.  He does not have pets in the home.  Denies any oral lesions or difficulty swallowing or shortness of breath. Patient's blood pressure is elevated on arrival today.  Patient reports he has not taken his blood pressure medication today.  He is asymptomatic shortness of breath, chest pain, headache or dizziness. Past Medical History:  Diagnosis Date  . GERD (gastroesophageal reflux disease)   . Gout   . Hypertension   . Seasonal allergies     Patient Active Problem List   Diagnosis Date Noted  . Hypertriglyceridemia 02/20/2016  . Diabetes mellitus type 2, controlled, without complications (HCC) 02/06/2016  . Low back pain 07/16/2013  . 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 Erectile dysfunction 07/16/2013  . Preventive measure 06/18/2013  . Gout 03/09/2012  . Hypertension 03/09/2012    Past Surgical History:  Procedure Laterality Date  . BACK SURGERY         Home Medications    Prior to Admission medications   Medication Sig Start Date End Date Taking? Authorizing Provider  allopurinol (ZYLOPRIM) 300 MG tablet TAKE 1 TABLET (300 MG TOTAL) BY MOUTH DAILY. 09/05/17  Yes Monica Becton, MD  amLODipine (NORVASC) 10 MG tablet TAKE 1 TABLET (10 MG TOTAL) BY MOUTH DAILY. 03/14/18  Yes Monica Becton, MD  fluticasone (FLONASE) 50 MCG/ACT nasal spray INSTILL 1  SPRAY INTO EACH NOSTRIL TWICE A DAY,USE LEFT HAND FOR RIGHT NOSTRIL, RIGHT HAND FOR LEFT 05/25/17  Yes Monica Becton, MD  ibuprofen (ADVIL,MOTRIN) 800 MG tablet Take 1 tablet (800 mg total) by mouth 3 (three) times daily. 11/28/17  Yes Phelps, Erin O, PA-C  irbesartan (AVAPRO) 300 MG tablet Take 300 mg by mouth daily. 07/31/20  Yes [provider]  metoprolol tartrate (LOPRESSOR) 50 MG tablet TAKE ONE TABLET BY MOUTH TWICE DAILY 08/25/18  Yes Monica Becton, MD  Multiple Vitamins-Minerals (CENTRUM SILVER 50+MEN) TABS Take 1 tablet by mouth daily.   Yes [provider]  omeprazole (PRILOSEC) 20 MG capsule TAKE 1 CAPSULE (20 MG TOTAL) BY MOUTH DAILY. 08/27/16  Yes Monica Becton, MD  sildenafil (REVATIO) 20 MG tablet Take 1-5 tablets (20-100 mg total) by mouth as needed. 12/31/16  Yes Monica Becton, MD  triamcinolone (KENALOG) 0.025 % ointment Apply 1 application topically 2 (two) times daily as needed. 08/24/20  Yes Bing Neighbors, FNP  Canagliflozin-Metformin HCl ER (INVOKAMET XR) [phone] MG TB24 Take 2 tablets by mouth daily. 02/26/16   Monica Becton, MD  cyclobenzaprine (FLEXERIL) 10 MG tablet Take 1 tablet (10 mg total) by mouth at bedtime. 07/23/16   Lattie Haw, MD  fenofibrate 160 MG tablet Take 1 tablet (160 mg total) by mouth daily. 02/20/16   Monica Becton, MD  fexofenadine (ALLEGRA) 180 MG tablet TAKE 1 TABLET (180 MG  TOTAL) BY MOUTH DAILY. 10/15/16   Monica Becton, MD  HYDROcodone-acetaminophen (NORCO/VICODIN) 5-325 MG tablet Take 1 tablet by mouth every 6 (six) hours as needed for moderate pain. Take one by mouth at bedtime as needed for pain 07/23/16   Lattie Haw, MD  valsartan (DIOVAN) 320 MG tablet Take 1 tablet (320 mg total) by mouth daily. 06/06/18   Monica Becton, MD    Family History Family History  Problem Relation Age of Onset  . Hypertension Father     Social History Social  History   Tobacco Use  . Smoking status: Current Some Day Smoker  . Smokeless tobacco: Never Used  Substance Use Topics  . Alcohol use: Yes  . Drug use: No     Allergies   Patient has no known allergies.   Review of Systems Review of Systems Pertinent negatives listed in HPI  Physical Exam Triage Vital Signs ED Triage Vitals [08/24/20 1436]  Enc Vitals Group     BP (!) 184/102     Pulse Rate 66     Resp      Temp 98.5 F (36.9 C)     Temp Source Oral     SpO2 94 %     Weight      Height      Head Circumference      Peak Flow      Pain Score 0     Pain Loc      Pain Edu?      Excl. in GC?    No data found.  Updated Vital Signs BP (!) 159/100 (BP Location: Left Arm)   Pulse 66   Temp 98.5 F (36.9 C) (Oral)   SpO2 94%   Visual Acuity Right Eye Distance:   Left Eye Distance:   Bilateral Distance:    Right Eye Near:   Left Eye Near:    Bilateral Near:     Physical Exam Constitutional:      Appearance: He is obese. He is not ill-appearing.  Cardiovascular:     Rate and Rhythm: Normal rate and regular rhythm.  Pulmonary:     Effort: Pulmonary effort is normal.     Breath sounds: Normal breath sounds.  Skin:      Neurological:     General: No focal deficit present.     Mental Status: He is alert.     GCS: GCS eye subscore is 4. GCS verbal subscore is 5. GCS motor subscore is 6.  Psychiatric:        Attention and Perception: Attention and perception normal.        Mood and Affect: Mood normal.        Speech: Speech normal.        Cognition and Memory: Cognition normal.      UC Treatments / Results  Labs (all labs ordered are listed, but only abnormal results are displayed) Labs Reviewed - No data to display  EKG   Radiology No results found.  Procedures Procedures (including critical care time)  Medications Ordered in UC Medications  methylPREDNISolone sodium succinate (SOLU-MEDROL) 125 mg/2 mL injection 125 mg (125 mg  Intramuscular Given 08/24/20 1505)    Initial Impression / Assessment and Plan / UC Course  I have reviewed the triage vital signs and the nursing notes.  Pertinent labs & imaging results that were available during my care of the patient were reviewed by me and considered in my medical decision making (see chart  for details).    Contact dermatitis treating with a topical triamcinolone cream twice daily until rash resolves.  Return precautions given if rash does not resolve. Repeat blood pressure improved to 159/100.  Patient encouraged to consistently take his blood pressure medication as prescribed. Monitor blood pressure readings at home and follow-up with primary care provider if readings remain consistently greater than 160/100. Patient verbalized understanding agreement with plan. Final Clinical Impressions(s) / UC Diagnoses   Final diagnoses:  Irritant contact dermatitis, unspecified trigger  Elevated blood pressure reading in office with diagnosis of hypertension     Discharge Instructions     Monitor your blood pressure at home to ensure that medication is responding effectively.  If your blood pressure remains greater than 160/100 follow-up immediately with your primary care doctor for further management of your medications.  For your contact dermatitis you received a steroid injection while you were here at urgent care this will clear the skin reaction mostly therefore you only need the use of triamcinolone cream if you are itching and to help rash resolve quicker.   ED Prescriptions    Medication Sig Dispense Auth. Provider   triamcinolone (KENALOG) 0.025 % ointment Apply 1 application topically 2 (two) times daily as needed. 90 g Bing Neighbors, FNP     PDMP not reviewed this encounter.   Bing Neighbors, FNP 08/28/20 1505

## 2021-04-19 ENCOUNTER — Other Ambulatory Visit: Payer: Self-pay

## 2021-04-19 ENCOUNTER — Emergency Department
Admission: EM | Admit: 2021-04-19 | Discharge: 2021-04-19 | Disposition: A | Discharge status: Home / Self Care | Payer: 59 | Source: Home / Self Care

## 2021-04-19 ENCOUNTER — Encounter: Payer: Self-pay | Admitting: Emergency Medicine

## 2021-04-19 DIAGNOSIS — Z86010 Personal history of colonic polyps: Secondary | ICD-10-CM

## 2021-04-19 DIAGNOSIS — R197 Diarrhea, unspecified: Secondary | ICD-10-CM

## 2021-04-19 MED ORDER — DIPHENOXYLATE-ATROPINE 2.5-0.025 MG PO TABS: 1.0000 | ORAL_TABLET | Freq: Four times a day (QID) | ORAL | 0 refills | Status: AC | PRN

## 2021-04-19 MED ORDER — CIPROFLOXACIN HCL 500 MG PO TABS: 500.0000 mg | ORAL_TABLET | Freq: Two times a day (BID) | ORAL | 0 refills | Status: AC

## 2021-04-19 NOTE — ED Triage Notes (Signed)
Patient presents to Urgent Care with complaints of diarrhea since 1.5 week ago. Patient reports unable to drive for work due to the diarrhea. Took Alka seltzer, Pepto bismol. No new foods. Going about 8 times this morning. No abdominal cramping. Will have an aching sensation before diarrhea. Denies any fever or chills.

## 2021-04-19 NOTE — Discharge Instructions (Signed)
Your blood pressure is elevated.  Make sure to repeat your blood pressure when you are well.  Follow-up with your primary care doctor for blood pressure  For the diarrhea I recommend you take ciprofloxacin 2 times a day for 5 days.  In addition continue to drink lots of water, and fluids with electrolytes like sports drinks or Gatorade or Pedialyte  I am prescribing Lomotil to take for diarrhea.  Take 1 pill, and then may repeat after each diarrhea event up to 4 a day.  If you take too frequently, you will end up with constipation  See your doctor if not improving next week

## 2021-11-21 ENCOUNTER — Emergency Department
Admission: EM | Admit: 2021-11-21 | Discharge: 2021-11-21 | Disposition: A | Discharge status: Home / Self Care | Payer: BLUE CROSS/BLUE SHIELD | Source: Home / Self Care

## 2021-11-21 ENCOUNTER — Other Ambulatory Visit: Payer: Self-pay

## 2021-11-21 ENCOUNTER — Other Ambulatory Visit (HOSPITAL_COMMUNITY)
Admission: RE | Admit: 2021-11-21 | Discharge: 2021-11-21 | Disposition: A | Discharge status: Home / Self Care | Payer: BLUE CROSS/BLUE SHIELD | Source: Ambulatory Visit | Attending: Family Medicine | Admitting: Family Medicine

## 2021-11-21 ENCOUNTER — Encounter: Payer: Self-pay | Admitting: Family Medicine

## 2021-11-21 DIAGNOSIS — Z202 Contact with and (suspected) exposure to infections with a predominantly sexual mode of transmission: Secondary | ICD-10-CM

## 2021-11-21 NOTE — ED Provider Notes (Signed)
[facility]    CSN: [id number] Arrival date & time: 11/21/21  1258      History   Chief Complaint Chief Complaint  Patient presents with   Exposure to STD    HPI Eduardo Mclean is a 61 y.o. male.   HPI Pleasant 61 year old male presents with request for STD testing.  Reports new partner and would like STD testing.  Patient denies any current symptoms.  PMH significant for HTN, T2DM, and obesity.  Past Medical History:  Diagnosis Date   GERD (gastroesophageal reflux disease)    Gout    Hypertension    Seasonal allergies     Patient Active Problem List   Diagnosis Date Noted   Hypertriglyceridemia 02/20/2016   Diabetes mellitus type 2, controlled, without complications ([place]) [id number]   Low back pain 07/16/2013   Male hypogonadism 07/16/2013   Erectile dysfunction 07/16/2013   Preventive measure 06/18/2013   Gout 03/09/2012   Hypertension 03/09/2012    Past Surgical History:  Procedure Laterality Date   BACK SURGERY         Home Medications    Prior to Admission medications   Medication Sig Start Date End Date Taking? Authorizing Provider  allopurinol (ZYLOPRIM) 300 MG tablet TAKE 1 TABLET (300 MG TOTAL) BY MOUTH DAILY. 09/05/17   Silverio Decamp, MD  amLODipine (NORVASC) 10 MG tablet TAKE 1 TABLET (10 MG TOTAL) BY MOUTH DAILY. 03/14/18   Silverio Decamp, MD  ciprofloxacin (CIPRO) 500 MG tablet Take 1 tablet (500 mg total) by mouth 2 (two) times daily. 04/19/21   Raylene Everts, MD  diphenoxylate-atropine (LOMOTIL) 2.5-0.025 MG tablet Take 1 tablet by mouth 4 (four) times daily as needed for diarrhea or loose stools. 04/19/21   Raylene Everts, MD  fexofenadine (ALLEGRA) 180 MG tablet TAKE 1 TABLET (180 MG TOTAL) BY MOUTH DAILY. 10/15/16   Silverio Decamp, MD  fluticasone (FLONASE) 50 MCG/ACT nasal spray INSTILL 1 SPRAY INTO EACH NOSTRIL TWICE A DAY,USE LEFT HAND FOR RIGHT NOSTRIL, RIGHT HAND FOR LEFT 05/25/17   Silverio Decamp, MD  irbesartan (AVAPRO) 300 MG tablet Take 300 mg by mouth daily. 07/31/20   [provider]  metoprolol tartrate (LOPRESSOR) 50 MG tablet TAKE ONE TABLET BY MOUTH TWICE DAILY 08/25/18   Silverio Decamp, MD  Multiple Vitamins-Minerals (CENTRUM SILVER 50+MEN) TABS Take 1 tablet by mouth daily.    [provider]  omeprazole (PRILOSEC) 20 MG capsule TAKE 1 CAPSULE (20 MG TOTAL) BY MOUTH DAILY. 08/27/16   Silverio Decamp, MD  sildenafil (REVATIO) 20 MG tablet Take 1-5 tablets (20-100 mg total) by mouth as needed. 12/31/16   Silverio Decamp, MD  triamcinolone (KENALOG) 0.025 % ointment Apply 1 application topically 2 (two) times daily as needed. 08/24/20   Scot Jun, FNP    Family History Family History  Problem Relation Age of Onset   Hypertension Father     Social History Social History   Tobacco Use   Smoking status: Some Days   Smokeless tobacco: Never  Substance Use Topics   Alcohol use: Yes   Drug use: No     Allergies   Patient has no known allergies.   Review of Systems Review of Systems  All other systems reviewed and are negative.   Physical Exam Triage Vital Signs ED Triage Vitals  Enc Vitals Group     BP      Pulse      Resp  Temp      Temp src      SpO2      Weight      Height      Head Circumference      Peak Flow      Pain Score      Pain Loc      Pain Edu?      Excl. in [place]?    No data found.  Updated Vital Signs BP (!) 148/89 (BP Location: Right Arm)   Pulse 68   Temp 98 F (36.7 C) (Oral)   Resp 18   SpO2 99%   Physical Exam Vitals and nursing note reviewed.  Constitutional:      Appearance: He is obese.  HENT:     Head: Normocephalic and atraumatic.     Mouth/Throat:     Mouth: Mucous membranes are moist.     Pharynx: Oropharynx is clear.  Eyes:     Extraocular Movements: Extraocular movements intact.     Conjunctiva/sclera: Conjunctivae normal.     Pupils: Pupils are  equal, round, and reactive to light.  Cardiovascular:     Rate and Rhythm: Normal rate and regular rhythm.     Pulses: Normal pulses.     Heart sounds: Normal heart sounds. No murmur heard. Pulmonary:     Effort: Pulmonary effort is normal.     Breath sounds: Normal breath sounds. No wheezing, rhonchi or rales.  Musculoskeletal:     Cervical back: Normal range of motion and neck supple.  Skin:    General: Skin is warm and dry.  Neurological:     General: No focal deficit present.     Mental Status: He is oriented to person, place, and time. Mental status is at baseline.     UC Treatments / Results  Labs (all labs ordered are listed, but only abnormal results are displayed) Labs Reviewed  HIV ANTIBODY (ROUTINE TESTING W REFLEX)  RPR  HEPATITIS C ANTIBODY  CYTOLOGY, (ORAL, ANAL, URETHRAL) ANCILLARY ONLY    EKG   Radiology No results found.  Procedures Procedures (including critical care time)  Medications Ordered in UC Medications - No data to display  Initial Impression / Assessment and Plan / UC Course  I have reviewed the triage vital signs and the nursing notes.  Pertinent labs & imaging results that were available during my care of the patient were reviewed by me and considered in my medical decision making (see chart for details).     MDM: 1.  Possible exposure to STD-Aptima swab ordered, RPR, Hepatitis C, HIV antibody ordered. Advised patient we will follow-up with lab results once received.  Patient discharged home, hemodynamically stable. Final Clinical Impressions(s) / UC Diagnoses   Final diagnoses:  Possible exposure to STD     Discharge Instructions      Advised patient we will follow-up with lab results once received.     ED Prescriptions   None    PDMP not reviewed this encounter.   Eliezer Lofts, [place] 11/21/21 1343

## 2021-11-21 NOTE — ED Triage Notes (Signed)
Pt states he has been with a new partner and would like STD testing. Denies symptoms

## 2021-11-21 NOTE — Discharge Instructions (Addendum)
Advised patient we will follow-up with lab results once received. 

## 2021-11-23 LAB — CYTOLOGY, (ORAL, ANAL, URETHRAL) ANCILLARY ONLY
Chlamydia: NEGATIVE
Comment: NEGATIVE
Comment: NEGATIVE
Comment: NORMAL
Neisseria Gonorrhea: NEGATIVE
Trichomonas: NEGATIVE

## 2021-11-24 LAB — HEPATITIS C ANTIBODY
Hepatitis C Ab: NONREACTIVE
SIGNAL TO CUT-OFF: 0.03 (ref <1.00)

## 2021-11-24 LAB — RPR: RPR Ser Ql: NONREACTIVE

## 2021-11-24 LAB — HIV ANTIBODY (ROUTINE TESTING W REFLEX): HIV 1&2 Ab, 4th Generation: NONREACTIVE
